# Patient Record
Sex: Male | Born: 1975 | Race: White | Hispanic: No | Marital: Married | Smoking: Never smoker
Health system: Southern US, Community
[De-identification: ages and names within clinical notes are randomized; demographics above are authoritative.]

## PROBLEM LIST (undated history)

## (undated) DIAGNOSIS — I2510 Atherosclerotic heart disease of native coronary artery without angina pectoris: Secondary | ICD-10-CM

## (undated) DIAGNOSIS — E079 Disorder of thyroid, unspecified: Secondary | ICD-10-CM

## (undated) DIAGNOSIS — E785 Hyperlipidemia, unspecified: Secondary | ICD-10-CM

## (undated) HISTORY — DX: Atherosclerotic heart disease of native coronary artery without angina pectoris: I25.10

## (undated) HISTORY — PX: EYE SURGERY: SHX253

---

## 2013-01-17 ENCOUNTER — Emergency Department (HOSPITAL_COMMUNITY)
Admission: EM | Admit: 2013-01-17 | Discharge: 2013-01-17 | Disposition: A | Discharge status: Home / Self Care | Payer: No Typology Code available for payment source | Attending: Emergency Medicine | Admitting: Emergency Medicine

## 2013-01-17 ENCOUNTER — Emergency Department (HOSPITAL_COMMUNITY): Payer: No Typology Code available for payment source

## 2013-01-17 ENCOUNTER — Encounter (HOSPITAL_COMMUNITY): Payer: Self-pay

## 2013-01-17 DIAGNOSIS — S59909A Unspecified injury of unspecified elbow, initial encounter: Secondary | ICD-10-CM | POA: Insufficient documentation

## 2013-01-17 DIAGNOSIS — R110 Nausea: Secondary | ICD-10-CM | POA: Insufficient documentation

## 2013-01-17 DIAGNOSIS — S6990XA Unspecified injury of unspecified wrist, hand and finger(s), initial encounter: Secondary | ICD-10-CM | POA: Insufficient documentation

## 2013-01-17 DIAGNOSIS — M25522 Pain in left elbow: Secondary | ICD-10-CM

## 2013-01-17 DIAGNOSIS — Y9389 Activity, other specified: Secondary | ICD-10-CM | POA: Insufficient documentation

## 2013-01-17 DIAGNOSIS — S0993XA Unspecified injury of face, initial encounter: Secondary | ICD-10-CM | POA: Insufficient documentation

## 2013-01-17 DIAGNOSIS — S0990XA Unspecified injury of head, initial encounter: Secondary | ICD-10-CM | POA: Insufficient documentation

## 2013-01-17 DIAGNOSIS — IMO0002 Reserved for concepts with insufficient information to code with codable children: Secondary | ICD-10-CM | POA: Insufficient documentation

## 2013-01-17 DIAGNOSIS — Z79899 Other long term (current) drug therapy: Secondary | ICD-10-CM | POA: Insufficient documentation

## 2013-01-17 DIAGNOSIS — T148XXA Other injury of unspecified body region, initial encounter: Secondary | ICD-10-CM

## 2013-01-17 DIAGNOSIS — E079 Disorder of thyroid, unspecified: Secondary | ICD-10-CM | POA: Insufficient documentation

## 2013-01-17 DIAGNOSIS — Y92410 Unspecified street and highway as the place of occurrence of the external cause: Secondary | ICD-10-CM | POA: Insufficient documentation

## 2013-01-17 HISTORY — DX: Disorder of thyroid, unspecified: E07.9
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
## 2013-01-17 NOTE — ED Notes (Signed)
Discharge instructions reviewed. Pt verbalized understanding.  
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
## 2013-01-18 NOTE — ED Provider Notes (Signed)
Medical screening examination/treatment/procedure(s) were performed by non-physician practitioner and as supervising physician I was immediately available for consultation/collaboration.   Janeth Terry M Kanisha Duba, MD 01/18/13 1235 

## 2015-02-27 IMAGING — CR DG ELBOW COMPLETE 3+V*L*
4 series · 4 of 4 positions shown · non-contrast
Comparison: None.

CLINICAL DATA: Left elbow injury.

EXAM:
LEFT ELBOW - COMPLETE 3+ VIEW

[x elbow joint ap left]
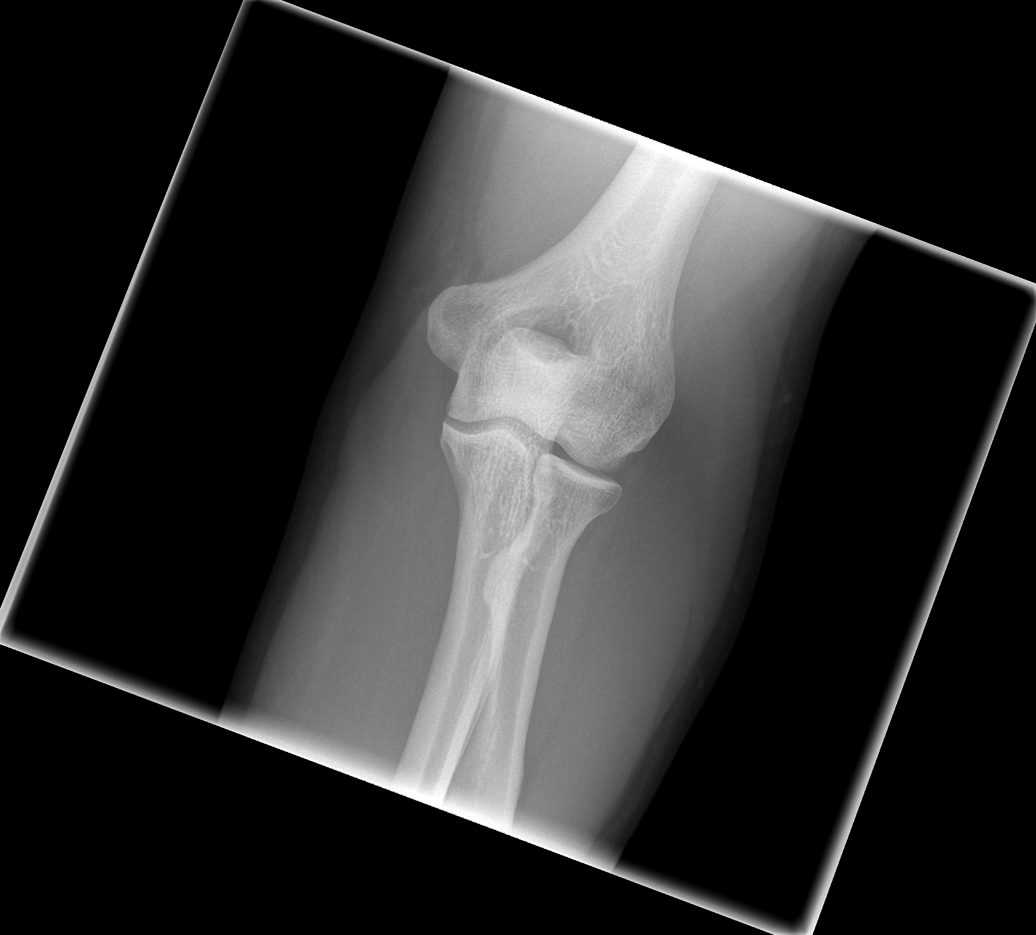

[x elbow joint obl. left (1 of 2)]
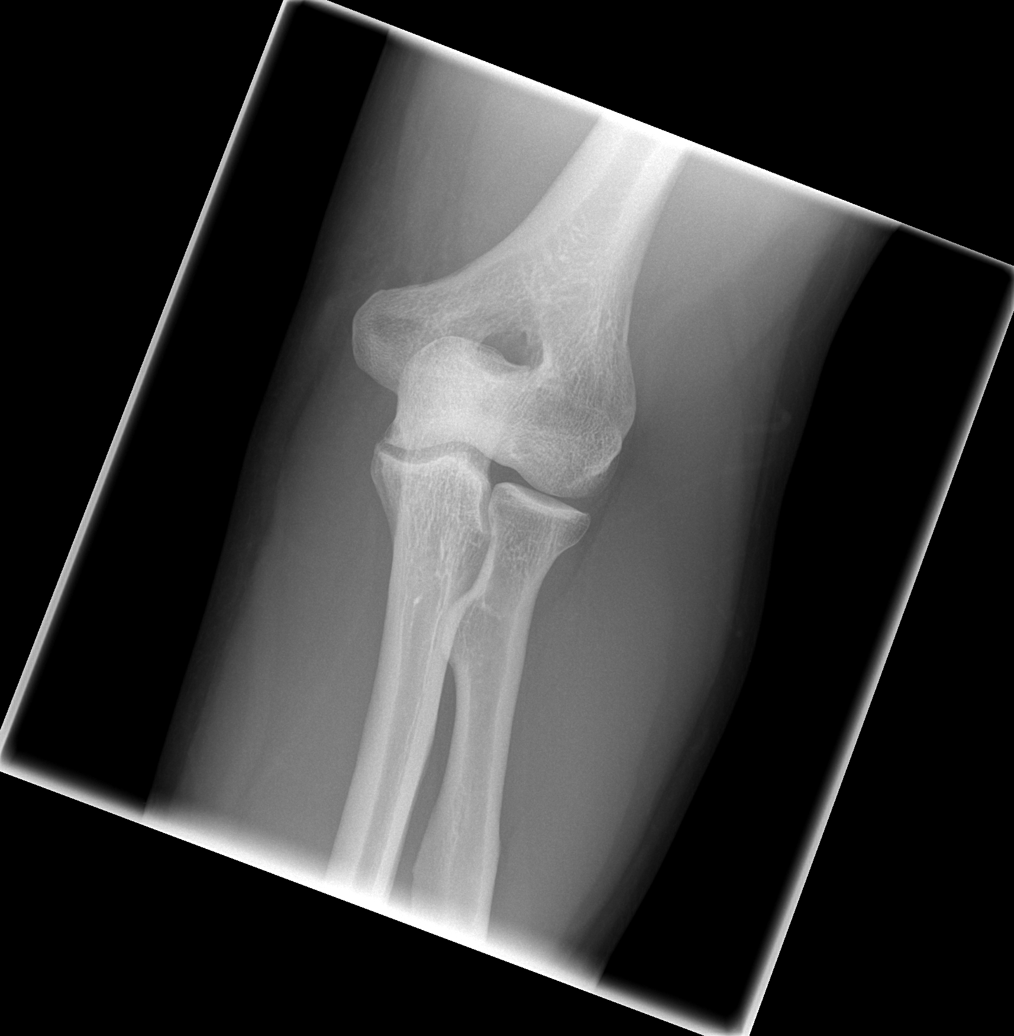

[x elbow joint obl. left (2 of 2)]
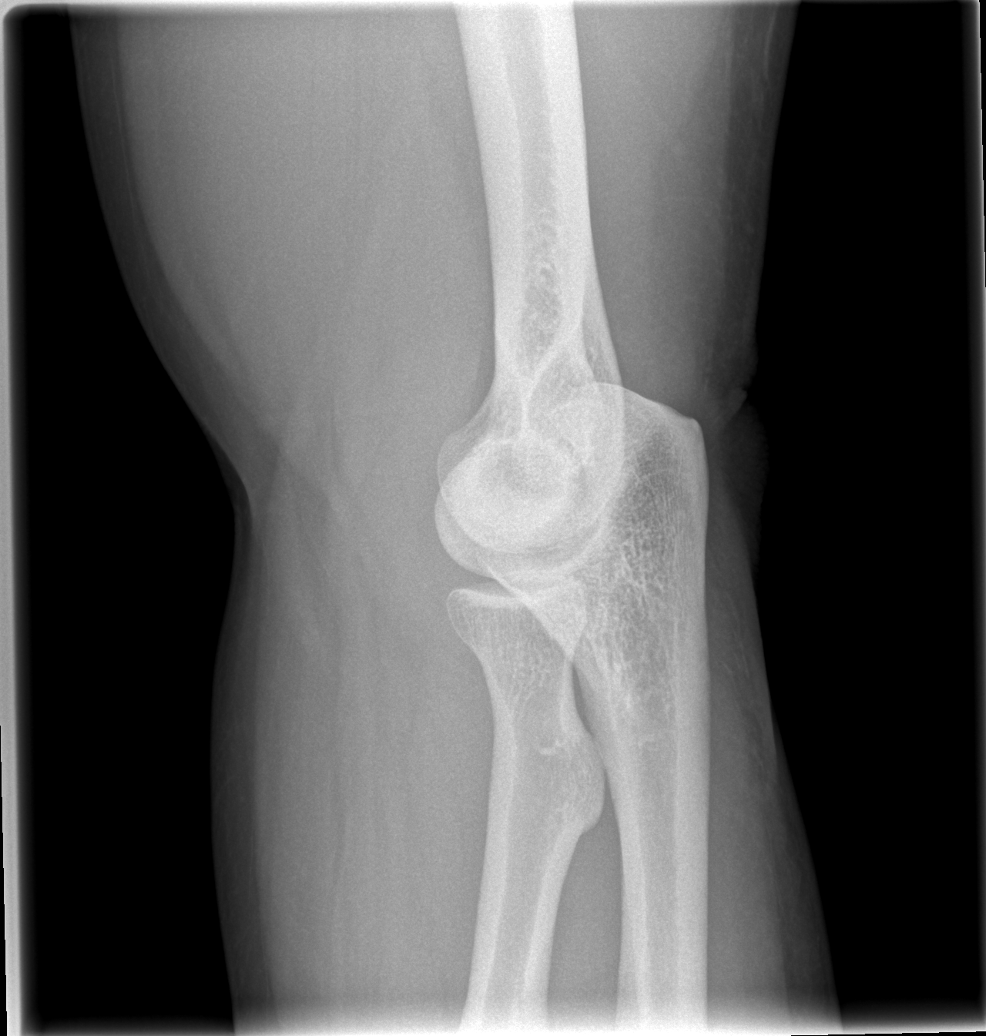

[x elbow joint lat left]
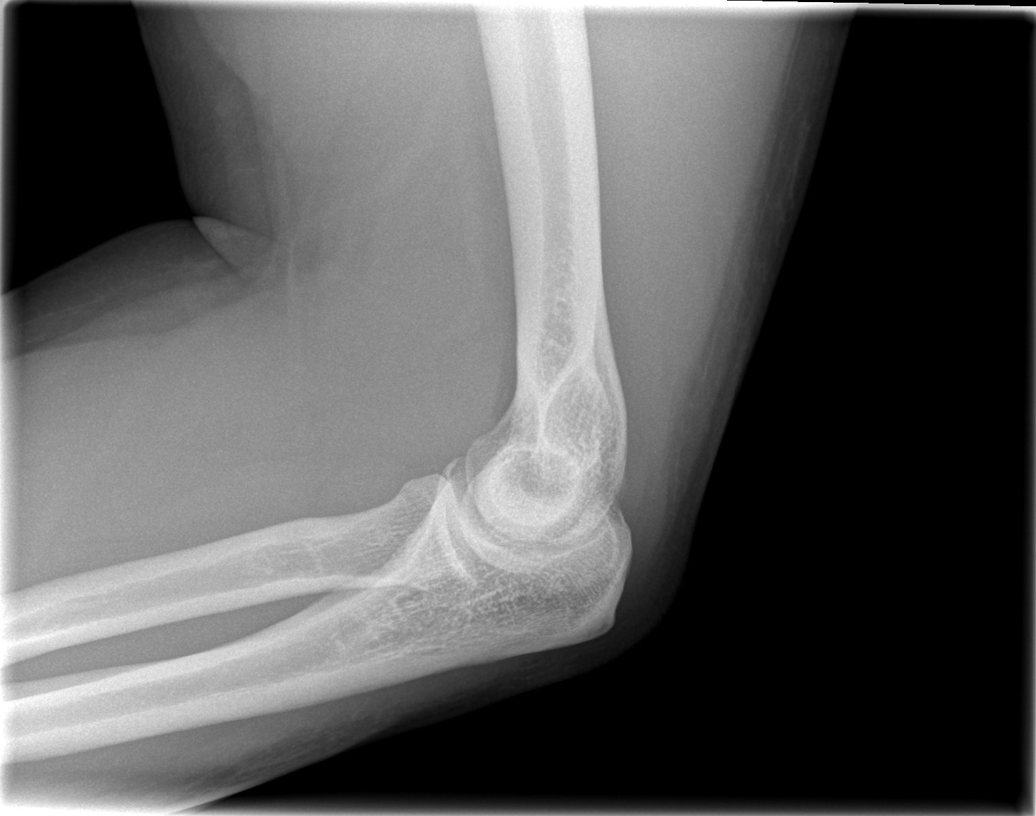

[4 of 4 positions shown; findings below may reference images not displayed]

FINDINGS: There is no evidence of fracture, dislocation, or joint effusion.
There is no evidence of arthropathy or other focal bone abnormality.
Soft tissues are unremarkable
IMPRESSION: Negative.

## 2018-08-15 ENCOUNTER — Emergency Department (HOSPITAL_COMMUNITY): Payer: 59

## 2018-08-15 ENCOUNTER — Encounter (HOSPITAL_COMMUNITY): Payer: Self-pay

## 2018-08-15 ENCOUNTER — Other Ambulatory Visit: Payer: Self-pay

## 2018-08-15 ENCOUNTER — Emergency Department (HOSPITAL_COMMUNITY)
Admission: EM | Admit: 2018-08-15 | Discharge: 2018-08-15 | Disposition: A | Discharge status: Home / Self Care | Payer: 59 | Attending: Emergency Medicine | Admitting: Emergency Medicine

## 2018-08-15 DIAGNOSIS — R05 Cough: Secondary | ICD-10-CM | POA: Diagnosis not present

## 2018-08-15 DIAGNOSIS — J069 Acute upper respiratory infection, unspecified: Secondary | ICD-10-CM | POA: Diagnosis not present

## 2018-08-15 DIAGNOSIS — R0602 Shortness of breath: Secondary | ICD-10-CM | POA: Insufficient documentation

## 2018-08-15 DIAGNOSIS — R0981 Nasal congestion: Secondary | ICD-10-CM | POA: Diagnosis not present

## 2018-08-15 DIAGNOSIS — E079 Disorder of thyroid, unspecified: Secondary | ICD-10-CM | POA: Insufficient documentation

## 2018-08-15 DIAGNOSIS — Z20828 Contact with and (suspected) exposure to other viral communicable diseases: Secondary | ICD-10-CM | POA: Insufficient documentation

## 2018-08-15 DIAGNOSIS — R059 Cough, unspecified: Secondary | ICD-10-CM

## 2018-08-15 DIAGNOSIS — R0789 Other chest pain: Secondary | ICD-10-CM | POA: Diagnosis not present

## 2018-08-15 LAB — BASIC METABOLIC PANEL WITH GFR
Anion gap: 14 (ref 5–15)
BUN: 15 mg/dL (ref 6–20)
CO2: 20 mmol/L — ABNORMAL LOW (ref 22–32)
Calcium: 8.9 mg/dL (ref 8.9–10.3)
Chloride: 104 mmol/L (ref 98–111)
Creatinine, Ser: 1 mg/dL (ref 0.61–1.24)
GFR calc Af Amer: >60 mL/min
GFR calc non Af Amer: >60 mL/min
Glucose, Bld: 86 mg/dL (ref 70–99)
Potassium: 3.8 mmol/L (ref 3.5–5.1)
Sodium: 138 mmol/L (ref 135–145)

## 2018-08-15 LAB — CBC
HCT: 44.5 % (ref 39.0–52.0)
Hemoglobin: 15 g/dL (ref 13.0–17.0)
MCH: 30.8 pg (ref 26.0–34.0)
MCHC: 33.7 g/dL (ref 30.0–36.0)
MCV: 91.4 fL (ref 80.0–100.0)
Platelets: 260 K/uL (ref 150–400)
RBC: 4.87 MIL/uL (ref 4.22–5.81)
RDW: 12.5 % (ref 11.5–15.5)
WBC: 8.4 K/uL (ref 4.0–10.5)
nRBC: 0 % (ref 0.0–0.2)

## 2018-08-15 LAB — TROPONIN I: Troponin I: <0.03 ng/mL (ref <0.03)

## 2018-08-15 MED ORDER — SODIUM CHLORIDE 0.9% FLUSH: 3.0000 mL | Freq: Once | INTRAVENOUS | Status: DC

## 2018-08-15 NOTE — Discharge Instructions (Addendum)
Person Under Monitoring Name: Todd [name]  Location: [address] [place] [address]   Infection Prevention Recommendations for Individuals Confirmed to have, or Being Evaluated for, 2019 Novel Coronavirus (COVID-19) Infection Who Receive Care at Home  Individuals who are confirmed to have, or are being evaluated for, COVID-19 should follow the prevention steps below until a healthcare provider or local or state health department says they can return to normal activities.  Stay home except to get medical care You should restrict activities outside your home, except for getting medical care. Do not go to work, school, or public areas, and do not use public transportation or taxis.  Call ahead before visiting your doctor Before your medical appointment, call the healthcare provider and tell them that you have, or are being evaluated for, COVID-19 infection. This will help the healthcare providers office take steps to keep other people from getting infected. Ask your healthcare provider to call the local or state health department.  Monitor your symptoms Seek prompt medical attention if your illness is worsening (e.g., difficulty breathing). Before going to your medical appointment, call the healthcare provider and tell them that you have, or are being evaluated for, COVID-19 infection. Ask your healthcare provider to call the local or state health department.  Wear a facemask You should wear a facemask that covers your nose and mouth when you are in the same room with other people and when you visit a healthcare provider. People who live with or visit you should also wear a facemask while they are in the same room with you.  Separate yourself from other people in your home As much as possible, you should stay in a different room from other people in your home. Also, you should use a separate bathroom, if available.  Avoid sharing household items You should not  share dishes, drinking glasses, cups, eating utensils, towels, bedding, or other items with other people in your home. After using these items, you should wash them thoroughly with soap and water.  Cover your coughs and sneezes Cover your mouth and nose with a tissue when you cough or sneeze, or you can cough or sneeze into your sleeve. Throw used tissues in a lined trash can, and immediately wash your hands with soap and water for at least 20 seconds or use an alcohol-based hand rub.  Wash your [employer] your hands often and thoroughly with soap and water for at least 20 seconds. You can use an alcohol-based hand sanitizer if soap and water are not available and if your hands are not visibly dirty. Avoid touching your eyes, nose, and mouth with unwashed hands.   Prevention Steps for Caregivers and Household Members of Individuals Confirmed to have, or Being Evaluated for, COVID-19 Infection Being Cared for in the Home  If you live with, or provide care at home for, a person confirmed to have, or being evaluated for, COVID-19 infection please follow these guidelines to prevent infection:  Follow healthcare providers instructions Make sure that you understand and can help the patient follow any healthcare provider instructions for all care.  Provide for the patients basic needs You should help the patient with basic needs in the home and provide support for getting groceries, prescriptions, and other personal needs.  Monitor the patients symptoms If they are getting sicker, call his or her medical provider and tell them that the patient has, or is being evaluated for, COVID-19 infection. This will help the healthcare providers office take  steps to keep other people from getting infected. Ask the healthcare provider to call the local or state health department.  Limit the number of people who have contact with the patient If possible, have only one caregiver for the  patient. Other household members should stay in another home or place of residence. If this is not possible, they should stay in another room, or be separated from the patient as much as possible. Use a separate bathroom, if available. Restrict visitors who do not have an essential need to be in the home.  Keep older adults, very young children, and other sick people away from the patient Keep older adults, very young children, and those who have compromised immune systems or chronic health conditions away from the patient. This includes people with chronic heart, lung, or kidney conditions, diabetes, and cancer.  Ensure good ventilation Make sure that shared spaces in the home have good air flow, such as from an air conditioner or an opened window, weather permitting.  Wash your hands often Wash your hands often and thoroughly with soap and water for at least 20 seconds. You can use an alcohol based hand sanitizer if soap and water are not available and if your hands are not visibly dirty. Avoid touching your eyes, nose, and mouth with unwashed hands. Use disposable paper towels to dry your hands. If not available, use dedicated cloth towels and replace them when they become wet.  Wear a facemask and gloves Wear a disposable facemask at all times in the room and gloves when you touch or have contact with the patients blood, body fluids, and/or secretions or excretions, such as sweat, saliva, sputum, nasal mucus, vomit, urine, or feces.  Ensure the mask fits over your nose and mouth tightly, and do not touch it during use. Throw out disposable facemasks and gloves after using them. Do not reuse. Wash your hands immediately after removing your facemask and gloves. If your personal clothing becomes contaminated, carefully remove clothing and launder. Wash your hands after handling contaminated clothing. Place all used disposable facemasks, gloves, and other waste in a lined container before  disposing them with other household waste. Remove gloves and wash your hands immediately after handling these items.  Do not share dishes, glasses, or other household items with the patient Avoid sharing household items. You should not share dishes, drinking glasses, cups, eating utensils, towels, bedding, or other items with a patient who is confirmed to have, or being evaluated for, COVID-19 infection. After the person uses these items, you should wash them thoroughly with soap and water.  Wash laundry thoroughly Immediately remove and wash clothes or bedding that have blood, body fluids, and/or secretions or excretions, such as sweat, saliva, sputum, nasal mucus, vomit, urine, or feces, on them. Wear gloves when handling laundry from the patient. Read and follow directions on labels of laundry or clothing items and detergent. In general, wash and dry with the warmest temperatures recommended on the label.  Clean all areas the individual has used often Clean all touchable surfaces, such as counters, tabletops, doorknobs, bathroom fixtures, toilets, phones, keyboards, tablets, and bedside tables, every day. Also, clean any surfaces that may have blood, body fluids, and/or secretions or excretions on them. Wear gloves when cleaning surfaces the patient has come in contact with. Use a diluted bleach solution (e.g., dilute bleach with 1 part bleach and 10 parts water) or a household disinfectant with a label that says EPA-registered for coronaviruses. To make a bleach solution  at home, add 1 tablespoon of bleach to 1 quart (4 cups) of water. For a larger supply, add  cup of bleach to 1 gallon (16 cups) of water. Read labels of cleaning products and follow recommendations provided on product labels. Labels contain instructions for safe and effective use of the cleaning product including precautions you should take when applying the product, such as wearing gloves or eye protection and making sure you  have good ventilation during use of the product. Remove gloves and wash hands immediately after cleaning.  Monitor yourself for signs and symptoms of illness Caregivers and household members are considered close contacts, should monitor their health, and will be asked to limit movement outside of the home to the extent possible. Follow the monitoring steps for close contacts listed on the symptom monitoring form.   ? If you have additional questions, contact your local health department or call the epidemiologist on call at [phone] (available 24/7). ? This guidance is subject to change. For the most up-to-date guidance from [facility], please refer to their website: [email or web address]

## 2018-08-15 NOTE — ED Provider Notes (Signed)
[facility] EMERGENCY DEPARTMENT Provider Note   CSN: [id number] Arrival date & time: 08/15/18  1737    History   Chief Complaint Chief Complaint  Patient presents with  . Chest Pain  . Shortness of Breath  . Cough    HPI Todd [name] is a 43 y.o. male.     HPI 43 year old man with no significant past medical history presents to the emergency department for evaluation of URI symptoms including cough, nasal congestion, chest tightness over the last several days.  No fevers at home that he is aware of.  Has had a decreased appetite but otherwise feeling well.  Went for a jog 2 days ago and felt somewhat more fatigued than usual.  Denies any chest pain at this time.  No family history of cardiac disease that he is aware of.  Does not smoke, drink alcohol or use illicit substances.  No history of hypertension or diabetes that he is aware of. Past Medical History:  Diagnosis Date  . Thyroid disease     There are no active problems to display for this patient.   History reviewed. No pertinent surgical history.      Home Medications    Prior to Admission medications   Medication Sig Start Date End Date Taking? Authorizing Provider  levothyroxine (SYNTHROID, LEVOTHROID) 88 MCG tablet Take 88 mcg by mouth daily before breakfast.    [provider]    Family History No family history on file.  Social History Social History   Tobacco Use  . Smoking status: Never Smoker  Substance Use Topics  . Alcohol use: No  . Drug use: No     Allergies   Tretinoin   Review of Systems Review of Systems  Constitutional: Negative for chills, fatigue and fever.  HENT: Negative for ear pain and sore throat.   Eyes: Negative for pain and visual disturbance.  Respiratory: Positive for cough, chest tightness and shortness of breath.   Cardiovascular: Negative for chest pain and palpitations.  Gastrointestinal: Negative for abdominal pain and vomiting.   Endocrine: Negative for polyphagia and polyuria.  Genitourinary: Negative for dysuria and hematuria.  Musculoskeletal: Negative for arthralgias and back pain.  Skin: Negative for color change and rash.  Allergic/Immunologic: Negative for immunocompromised state.  Neurological: Negative for seizures and syncope.  All other systems reviewed and are negative.    Physical Exam Updated Vital Signs BP (!) 135/91 (BP Location: Right Arm)   Pulse 80   Temp 98.2 F (36.8 C) (Oral)   Resp 18   Ht 6' (1.829 m)   Wt 122.5 kg   SpO2 95%   BMI 36.62 kg/m   Physical Exam Vitals signs and nursing note reviewed.  Constitutional:      Appearance: He is well-developed. He is not ill-appearing.  HENT:     Head: Normocephalic and atraumatic.  Eyes:     Extraocular Movements: Extraocular movements intact.     Conjunctiva/sclera: Conjunctivae normal.     Pupils: Pupils are equal, round, and reactive to light.  Neck:     Musculoskeletal: Neck supple.  Cardiovascular:     Rate and Rhythm: Normal rate and regular rhythm.     Heart sounds: No murmur.  Pulmonary:     Effort: Pulmonary effort is normal. No respiratory distress.     Breath sounds: Normal breath sounds.  Chest:     Chest wall: No tenderness or edema.  Abdominal:     Palpations: Abdomen is soft.  Tenderness: There is no abdominal tenderness.  Musculoskeletal:     Right lower leg: He exhibits no tenderness. No edema.     Left lower leg: He exhibits no tenderness. No edema.  Skin:    General: Skin is warm and dry.     Capillary Refill: Capillary refill takes less than 2 seconds.  Neurological:     General: No focal deficit present.     Mental Status: He is alert.     Cranial Nerves: No cranial nerve deficit.     Motor: No weakness.      ED Treatments / Results  Labs (all labs ordered are listed, but only abnormal results are displayed) Labs Reviewed  CBC  BASIC METABOLIC PANEL  TROPONIN I    EKG None   Radiology Dg Chest Portable 1 View  Result Date: 08/15/2018 CLINICAL DATA:  Shortness of breath, chest pain, cough, chills and congestion worsening over past week, has been exposed to COVID-19 positive patients at work EXAM: PORTABLE CHEST 1 VIEW COMPARISON:  Portable exam 1758 hours without priors for comparison FINDINGS: Normal heart size, mediastinal contours, and pulmonary vascularity. Lungs clear. No infiltrate, pleural effusion or pneumothorax. Bones unremarkable. IMPRESSION: No acute abnormalities. Electronically Signed   By: Ulyses Southward M.D.   On: 08/15/2018 18:28    Procedures Procedures (including critical care time)  Medications Ordered in ED Medications  sodium chloride flush (NS) 0.9 % injection 3 mL (has no administration in time range)     Initial Impression / Assessment and Plan / ED Course  I have reviewed the triage vital signs and the nursing notes.  Pertinent labs & imaging results that were available during my care of the patient were reviewed by me and considered in my medical decision making (see chart for details).        Well-appearing hemodynamically stable 43 year old man presents to the emergency department for evaluation of URI symptoms.  Has had multiple positive COVID contacts at work.  Would like to be tested for the coronavirus.  We have ordered the send out test for this patient as I believe he does not need to come into the hospital.  EKG shows sinus rhythm without ischemic changes such as ST depression or elevation or T wave inversions.  He does not have a history of cardiac disease nor do I believe that this is cardiac given his other symptoms of URI infection.  We will not test with an RVP at this time as it will not change management.  His daughter also has similar symptoms.  He would like to go back to work so we are performing the coronavirus test for him.  Patient told to self isolate until this test returned.  Told that if it returned positive he  would likely need to wait another 7 days or 3 days until after symptoms have gone away.  Low suspicion for ACS in this patient is his first troponin was negative and he has had ongoing symptoms for several days.  I believe he is stable for discharge and the patient is in agreement with this plan.  Final Clinical Impressions(s) / ED Diagnoses   Final diagnoses:  Cough  Viral upper respiratory tract infection    ED Discharge Orders    None       Ina Kick, MD 08/15/18 2208    Tilden Fossa, MD 08/16/18 [phone]

## 2018-08-15 NOTE — ED Triage Notes (Signed)
Pt repots cp, sob, cough, chills and congestion that has gotten worse over the past week. Pt has been exposed to positive COVID pts at work. Nad noted at this time

## 2018-08-15 NOTE — ED Notes (Signed)
Provider at bedside

## 2018-08-15 NOTE — ED Notes (Signed)
Discharge instructions discussed with pt. Pt has no questions at this time.   Unable to obtain signature pad not working.

## 2018-08-17 LAB — NOVEL CORONAVIRUS, NAA (HOSP ORDER, SEND-OUT TO REF LAB; TAT 18-24 HRS): SARS-CoV-2, NAA: NOT DETECTED

## 2020-02-19 DIAGNOSIS — E782 Mixed hyperlipidemia: Secondary | ICD-10-CM | POA: Diagnosis not present

## 2020-02-19 DIAGNOSIS — E8881 Metabolic syndrome: Secondary | ICD-10-CM | POA: Diagnosis not present

## 2020-02-19 DIAGNOSIS — Z6839 Body mass index (BMI) 39.0-39.9, adult: Secondary | ICD-10-CM | POA: Diagnosis not present

## 2020-04-06 DIAGNOSIS — U071 COVID-19: Secondary | ICD-10-CM | POA: Diagnosis not present

## 2020-04-20 DIAGNOSIS — Z79899 Other long term (current) drug therapy: Secondary | ICD-10-CM | POA: Diagnosis not present

## 2020-04-20 DIAGNOSIS — Z125 Encounter for screening for malignant neoplasm of prostate: Secondary | ICD-10-CM | POA: Diagnosis not present

## 2020-04-20 DIAGNOSIS — E039 Hypothyroidism, unspecified: Secondary | ICD-10-CM | POA: Diagnosis not present

## 2020-04-20 DIAGNOSIS — Z0000 Encounter for general adult medical examination without abnormal findings: Secondary | ICD-10-CM | POA: Diagnosis not present

## 2020-04-20 DIAGNOSIS — Z6839 Body mass index (BMI) 39.0-39.9, adult: Secondary | ICD-10-CM | POA: Diagnosis not present

## 2020-04-20 DIAGNOSIS — E782 Mixed hyperlipidemia: Secondary | ICD-10-CM | POA: Diagnosis not present

## 2020-04-21 DIAGNOSIS — Z125 Encounter for screening for malignant neoplasm of prostate: Secondary | ICD-10-CM | POA: Diagnosis not present

## 2020-04-21 DIAGNOSIS — Z0000 Encounter for general adult medical examination without abnormal findings: Secondary | ICD-10-CM | POA: Diagnosis not present

## 2020-04-21 DIAGNOSIS — Z79899 Other long term (current) drug therapy: Secondary | ICD-10-CM | POA: Diagnosis not present

## 2020-04-21 DIAGNOSIS — E782 Mixed hyperlipidemia: Secondary | ICD-10-CM | POA: Diagnosis not present

## 2020-04-21 DIAGNOSIS — E291 Testicular hypofunction: Secondary | ICD-10-CM | POA: Diagnosis not present

## 2020-04-21 DIAGNOSIS — E039 Hypothyroidism, unspecified: Secondary | ICD-10-CM | POA: Diagnosis not present

## 2020-09-24 IMAGING — DX PORTABLE CHEST - 1 VIEW
1 series · 1 of 1 positions shown · non-contrast
Comparison: Portable exam 7981 hours without priors for comparison

CLINICAL DATA: Shortness of breath, chest pain, cough, chills and
congestion worsening over past week, has been exposed to [id number]
positive patients at work

EXAM:
PORTABLE CHEST 1 VIEW

[chest]
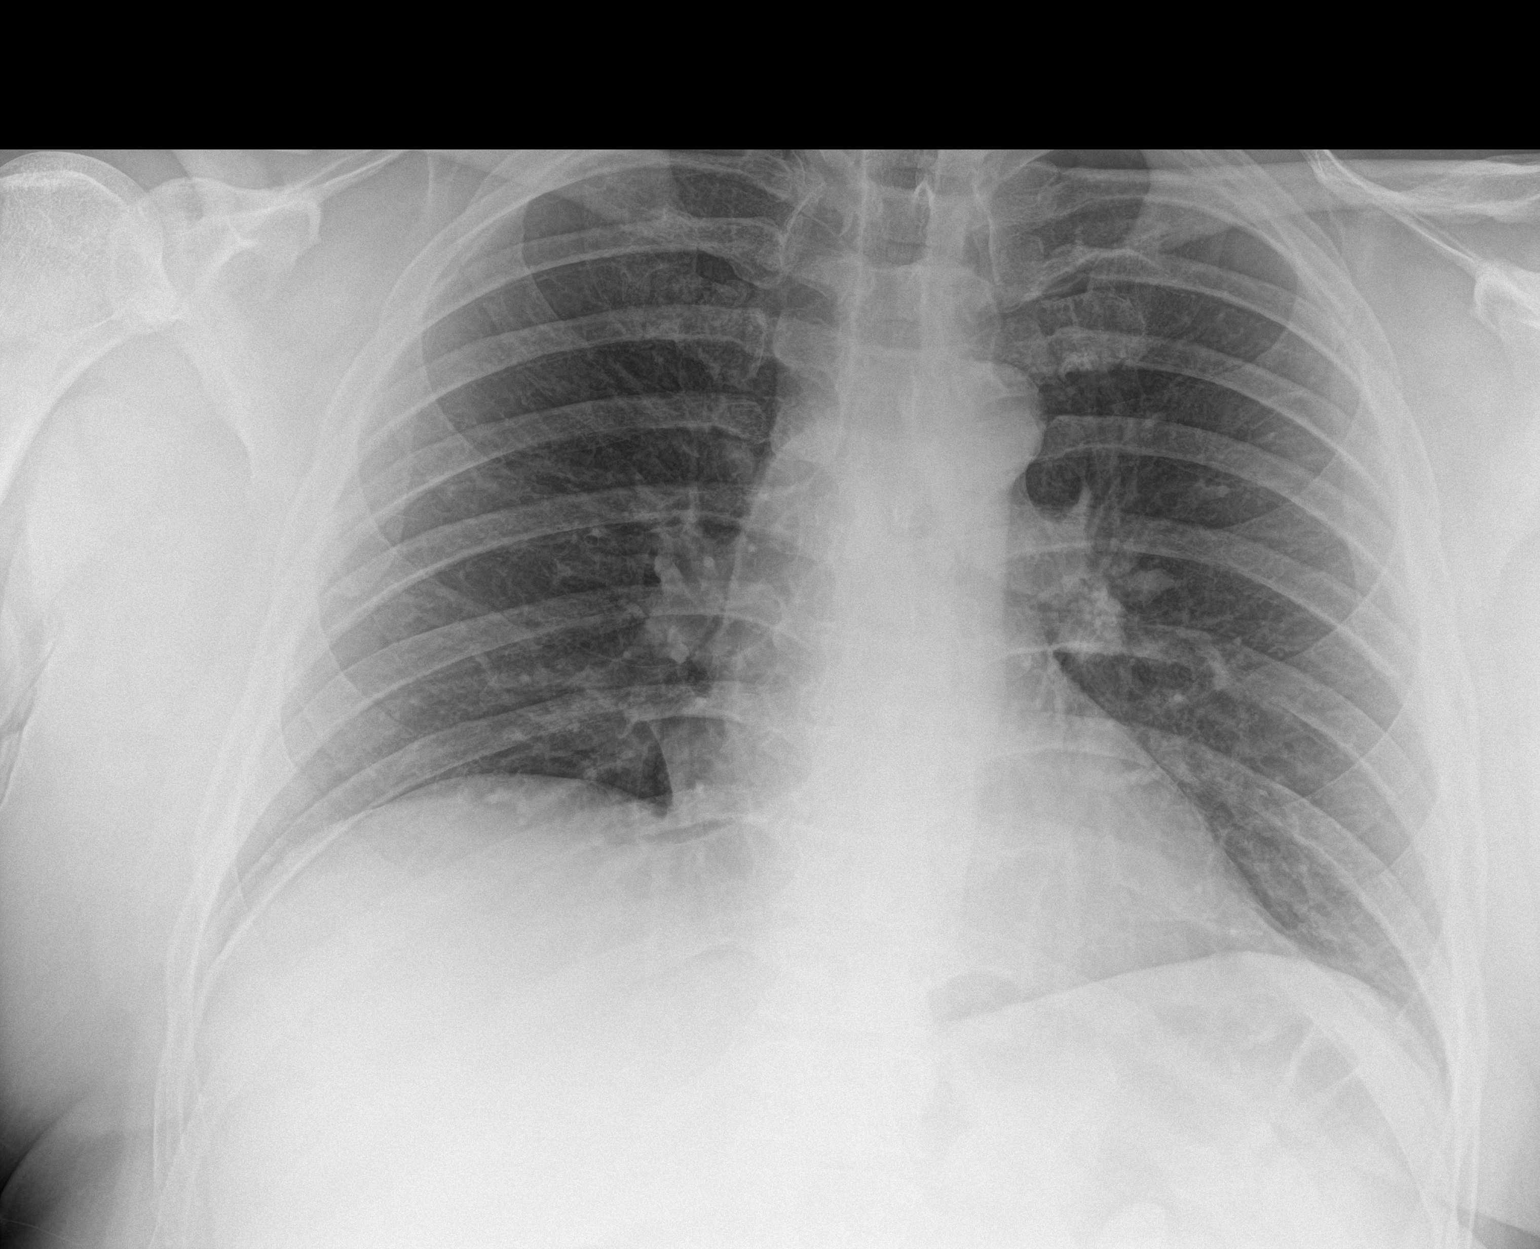

[1 of 1 positions shown; findings below may reference images not displayed]

FINDINGS: Normal heart size, mediastinal contours, and pulmonary vascularity.

Lungs clear.

No infiltrate, pleural effusion or pneumothorax.

Bones unremarkable.
IMPRESSION: No acute abnormalities.

## 2020-11-08 ENCOUNTER — Encounter (HOSPITAL_BASED_OUTPATIENT_CLINIC_OR_DEPARTMENT_OTHER): Payer: Self-pay | Admitting: Radiology

## 2020-11-08 ENCOUNTER — Other Ambulatory Visit (HOSPITAL_BASED_OUTPATIENT_CLINIC_OR_DEPARTMENT_OTHER): Payer: Self-pay | Admitting: Adult Health Nurse Practitioner

## 2020-11-08 ENCOUNTER — Ambulatory Visit (HOSPITAL_BASED_OUTPATIENT_CLINIC_OR_DEPARTMENT_OTHER)
Admission: RE | Admit: 2020-11-08 | Discharge: 2020-11-08 | Disposition: A | Discharge status: Home / Self Care | Payer: No Typology Code available for payment source | Source: Ambulatory Visit | Attending: Adult Health Nurse Practitioner | Admitting: Adult Health Nurse Practitioner

## 2020-11-08 ENCOUNTER — Other Ambulatory Visit: Payer: Self-pay | Admitting: Adult Health Nurse Practitioner

## 2020-11-08 ENCOUNTER — Other Ambulatory Visit: Payer: Self-pay

## 2020-11-08 DIAGNOSIS — R1032 Left lower quadrant pain: Secondary | ICD-10-CM | POA: Diagnosis present

## 2020-11-08 MED ORDER — IOHEXOL 350 MG/ML SOLN
80.0000 mL | Freq: Once | INTRAVENOUS | Status: AC | PRN
  Administered 2020-11-08: 80 mL via INTRAVENOUS

## 2021-06-13 DIAGNOSIS — S62655A Nondisplaced fracture of medial phalanx of left ring finger, initial encounter for closed fracture: Secondary | ICD-10-CM | POA: Diagnosis not present

## 2021-07-12 DIAGNOSIS — E782 Mixed hyperlipidemia: Secondary | ICD-10-CM | POA: Diagnosis not present

## 2021-07-12 DIAGNOSIS — Z79899 Other long term (current) drug therapy: Secondary | ICD-10-CM | POA: Diagnosis not present

## 2021-07-12 DIAGNOSIS — B001 Herpesviral vesicular dermatitis: Secondary | ICD-10-CM | POA: Diagnosis not present

## 2021-07-12 DIAGNOSIS — Z125 Encounter for screening for malignant neoplasm of prostate: Secondary | ICD-10-CM | POA: Diagnosis not present

## 2021-07-12 DIAGNOSIS — E291 Testicular hypofunction: Secondary | ICD-10-CM | POA: Diagnosis not present

## 2021-07-12 DIAGNOSIS — E039 Hypothyroidism, unspecified: Secondary | ICD-10-CM | POA: Diagnosis not present

## 2021-07-12 DIAGNOSIS — Z0000 Encounter for general adult medical examination without abnormal findings: Secondary | ICD-10-CM | POA: Diagnosis not present

## 2021-12-21 ENCOUNTER — Other Ambulatory Visit: Payer: Self-pay

## 2021-12-21 ENCOUNTER — Emergency Department (HOSPITAL_BASED_OUTPATIENT_CLINIC_OR_DEPARTMENT_OTHER): Payer: BC Managed Care – PPO | Admitting: Radiology

## 2021-12-21 ENCOUNTER — Encounter (HOSPITAL_BASED_OUTPATIENT_CLINIC_OR_DEPARTMENT_OTHER): Payer: Self-pay | Admitting: Emergency Medicine

## 2021-12-21 ENCOUNTER — Inpatient Hospital Stay (HOSPITAL_BASED_OUTPATIENT_CLINIC_OR_DEPARTMENT_OTHER)
Admission: EM | Admit: 2021-12-21 | Discharge: 2021-12-23 | DRG: 246 | Disposition: A | Discharge status: Home / Self Care | Payer: BC Managed Care – PPO | Attending: Cardiovascular Disease | Admitting: Cardiovascular Disease

## 2021-12-21 ENCOUNTER — Inpatient Hospital Stay (HOSPITAL_COMMUNITY)
Admission: EM | Disposition: A | Discharge status: Home / Self Care | Payer: Self-pay | Source: Home / Self Care | Attending: Cardiovascular Disease

## 2021-12-21 ENCOUNTER — Other Ambulatory Visit (HOSPITAL_COMMUNITY): Payer: BC Managed Care – PPO

## 2021-12-21 ENCOUNTER — Emergency Department (HOSPITAL_BASED_OUTPATIENT_CLINIC_OR_DEPARTMENT_OTHER): Payer: BC Managed Care – PPO

## 2021-12-21 DIAGNOSIS — Z888 Allergy status to other drugs, medicaments and biological substances status: Secondary | ICD-10-CM | POA: Diagnosis not present

## 2021-12-21 DIAGNOSIS — E785 Hyperlipidemia, unspecified: Secondary | ICD-10-CM | POA: Diagnosis present

## 2021-12-21 DIAGNOSIS — E782 Mixed hyperlipidemia: Secondary | ICD-10-CM | POA: Diagnosis present

## 2021-12-21 DIAGNOSIS — I472 Ventricular tachycardia, unspecified: Secondary | ICD-10-CM | POA: Diagnosis not present

## 2021-12-21 DIAGNOSIS — F419 Anxiety disorder, unspecified: Secondary | ICD-10-CM | POA: Diagnosis present

## 2021-12-21 DIAGNOSIS — Z79890 Hormone replacement therapy (postmenopausal): Secondary | ICD-10-CM

## 2021-12-21 DIAGNOSIS — E039 Hypothyroidism, unspecified: Secondary | ICD-10-CM | POA: Diagnosis present

## 2021-12-21 DIAGNOSIS — E669 Obesity, unspecified: Secondary | ICD-10-CM | POA: Diagnosis not present

## 2021-12-21 DIAGNOSIS — Z8249 Family history of ischemic heart disease and other diseases of the circulatory system: Secondary | ICD-10-CM

## 2021-12-21 DIAGNOSIS — Z8674 Personal history of sudden cardiac arrest: Secondary | ICD-10-CM | POA: Diagnosis present

## 2021-12-21 DIAGNOSIS — Z79899 Other long term (current) drug therapy: Secondary | ICD-10-CM

## 2021-12-21 DIAGNOSIS — Z20822 Contact with and (suspected) exposure to covid-19: Secondary | ICD-10-CM | POA: Diagnosis present

## 2021-12-21 DIAGNOSIS — I2510 Atherosclerotic heart disease of native coronary artery without angina pectoris: Secondary | ICD-10-CM | POA: Diagnosis not present

## 2021-12-21 DIAGNOSIS — I4729 Other ventricular tachycardia: Secondary | ICD-10-CM | POA: Diagnosis present

## 2021-12-21 DIAGNOSIS — I213 ST elevation (STEMI) myocardial infarction of unspecified site: Principal | ICD-10-CM

## 2021-12-21 DIAGNOSIS — Z136 Encounter for screening for cardiovascular disorders: Secondary | ICD-10-CM | POA: Diagnosis not present

## 2021-12-21 DIAGNOSIS — I25110 Atherosclerotic heart disease of native coronary artery with unstable angina pectoris: Secondary | ICD-10-CM | POA: Diagnosis not present

## 2021-12-21 DIAGNOSIS — Z95820 Peripheral vascular angioplasty status with implants and grafts: Secondary | ICD-10-CM

## 2021-12-21 DIAGNOSIS — I2121 ST elevation (STEMI) myocardial infarction involving left circumflex coronary artery: Secondary | ICD-10-CM | POA: Diagnosis not present

## 2021-12-21 DIAGNOSIS — I2129 ST elevation (STEMI) myocardial infarction involving other sites: Secondary | ICD-10-CM | POA: Diagnosis not present

## 2021-12-21 DIAGNOSIS — I4721 Torsades de pointes: Secondary | ICD-10-CM | POA: Diagnosis present

## 2021-12-21 DIAGNOSIS — I469 Cardiac arrest, cause unspecified: Secondary | ICD-10-CM

## 2021-12-21 DIAGNOSIS — I4901 Ventricular fibrillation: Secondary | ICD-10-CM | POA: Diagnosis present

## 2021-12-21 DIAGNOSIS — I462 Cardiac arrest due to underlying cardiac condition: Secondary | ICD-10-CM | POA: Diagnosis present

## 2021-12-21 DIAGNOSIS — R109 Unspecified abdominal pain: Secondary | ICD-10-CM | POA: Diagnosis not present

## 2021-12-21 DIAGNOSIS — Z7902 Long term (current) use of antithrombotics/antiplatelets: Secondary | ICD-10-CM

## 2021-12-21 DIAGNOSIS — Z6833 Body mass index (BMI) 33.0-33.9, adult: Secondary | ICD-10-CM

## 2021-12-21 DIAGNOSIS — R079 Chest pain, unspecified: Secondary | ICD-10-CM | POA: Diagnosis not present

## 2021-12-21 HISTORY — PX: CORONARY/GRAFT ACUTE MI REVASCULARIZATION: CATH118305

## 2021-12-21 HISTORY — DX: Hyperlipidemia, unspecified: E78.5

## 2021-12-21 HISTORY — PX: LEFT HEART CATH AND CORONARY ANGIOGRAPHY: CATH118249

## 2021-12-21 LAB — CBC
HCT: 50.8 % (ref 39.0–52.0)
Hemoglobin: 17.5 g/dL — ABNORMAL HIGH (ref 13.0–17.0)
MCH: 32.1 pg (ref 26.0–34.0)
MCHC: 34.4 g/dL (ref 30.0–36.0)
MCV: 93 fL (ref 80.0–100.0)
Platelets: 241 10*3/uL (ref 150–400)
RBC: 5.46 MIL/uL (ref 4.22–5.81)
RDW: 12.7 % (ref 11.5–15.5)
WBC: 5.6 10*3/uL (ref 4.0–10.5)
nRBC: 0 % (ref 0.0–0.2)

## 2021-12-21 LAB — BASIC METABOLIC PANEL
Anion gap: 9 (ref 5–15)
BUN: 20 mg/dL (ref 6–20)
CO2: 23 mmol/L (ref 22–32)
Calcium: 8.7 mg/dL — ABNORMAL LOW (ref 8.9–10.3)
Chloride: 106 mmol/L (ref 98–111)
Creatinine, Ser: 1.03 mg/dL (ref 0.61–1.24)
GFR, Estimated: >60 mL/min (ref >60)
Glucose, Bld: 106 mg/dL — ABNORMAL HIGH (ref 70–99)
Potassium: 4.1 mmol/L (ref 3.5–5.1)
Sodium: 138 mmol/L (ref 135–145)

## 2021-12-21 LAB — LIPID PANEL
Cholesterol: 159 mg/dL (ref 0–200)
HDL: 30 mg/dL — ABNORMAL LOW (ref >40)
LDL Cholesterol: 104 mg/dL — ABNORMAL HIGH (ref 0–99)
Total CHOL/HDL Ratio: 5.3 RATIO
Triglycerides: 126 mg/dL (ref <150)
VLDL: 25 mg/dL (ref 0–40)

## 2021-12-21 LAB — MRSA NEXT GEN BY PCR, NASAL: MRSA by PCR Next Gen: NOT DETECTED

## 2021-12-21 LAB — TROPONIN I (HIGH SENSITIVITY)
Troponin I (High Sensitivity): 9 ng/L (ref <18)
Troponin I (High Sensitivity): 27 ng/L — ABNORMAL HIGH (ref <18)

## 2021-12-21 LAB — MAGNESIUM: Magnesium: 2.1 mg/dL (ref 1.7–2.4)

## 2021-12-21 LAB — POCT ACTIVATED CLOTTING TIME
Activated Clotting Time: 263 seconds
Activated Clotting Time: 366 seconds

## 2021-12-21 LAB — SARS CORONAVIRUS 2 BY RT PCR: SARS Coronavirus 2 by RT PCR: NEGATIVE

## 2021-12-21 SURGERY — LEFT HEART CATH AND CORONARY ANGIOGRAPHY
Anesthesia: LOCAL

## 2021-12-21 MED ORDER — NOREPINEPHRINE 4 MG/250ML-% IV SOLN
INTRAVENOUS | Status: AC
  Filled 2021-12-21: qty 250

## 2021-12-21 MED ORDER — MIDAZOLAM HCL 2 MG/2ML IJ SOLN
INTRAMUSCULAR | Status: AC
  Filled 2021-12-21: qty 2

## 2021-12-21 MED ORDER — TIROFIBAN HCL IN NACL 5-0.9 MG/100ML-% IV SOLN
INTRAVENOUS | Status: AC | PRN
  Administered 2021-12-21: .075 ug/kg/min via INTRAVENOUS

## 2021-12-21 MED ORDER — AMIODARONE LOAD VIA INFUSION
150.0000 mg | Freq: Once | INTRAVENOUS | Status: AC
  Administered 2021-12-21: 150 mg via INTRAVENOUS
  Filled 2021-12-21: qty 83.34

## 2021-12-21 MED ORDER — LIDOCAINE HCL (PF) 1 % IJ SOLN
INTRAMUSCULAR | Status: DC | PRN
  Administered 2021-12-21: 2 mL via INTRADERMAL

## 2021-12-21 MED ORDER — HEPARIN SODIUM (PORCINE) 1000 UNIT/ML IJ SOLN
INTRAMUSCULAR | Status: DC | PRN
  Administered 2021-12-21: 10000 [IU] via INTRAVENOUS
  Administered 2021-12-21: 2000 [IU] via INTRAVENOUS

## 2021-12-21 MED ORDER — EPINEPHRINE 1 MG/10ML IJ SOSY
PREFILLED_SYRINGE | INTRAMUSCULAR | Status: AC | PRN
  Administered 2021-12-21: .1 mg via INTRAVENOUS

## 2021-12-21 MED ORDER — SODIUM CHLORIDE 0.9% FLUSH
3.0000 mL | INTRAVENOUS | Status: DC | PRN
Start: 2021-12-21 — End: 2021-12-23

## 2021-12-21 MED ORDER — KETOROLAC TROMETHAMINE 30 MG/ML IJ SOLN
30.0000 mg | Freq: Once | INTRAMUSCULAR | Status: AC
  Administered 2021-12-21: 30 mg via INTRAVENOUS
  Filled 2021-12-21: qty 1

## 2021-12-21 MED ORDER — HEPARIN SODIUM (PORCINE) 1000 UNIT/ML IJ SOLN
INTRAMUSCULAR | Status: AC
  Filled 2021-12-21: qty 10

## 2021-12-21 MED ORDER — HEPARIN BOLUS VIA INFUSION
4000.0000 [IU] | Freq: Once | INTRAVENOUS | Status: AC
  Administered 2021-12-21: 4000 [IU] via INTRAVENOUS

## 2021-12-21 MED ORDER — SODIUM CHLORIDE 0.9 % IV SOLN
INTRAVENOUS | Status: AC | PRN
  Administered 2021-12-21: 100 mL/h via INTRAVENOUS

## 2021-12-21 MED ORDER — NOREPINEPHRINE BITARTRATE 1 MG/ML IV SOLN
INTRAVENOUS | Status: AC | PRN
  Administered 2021-12-21: 5 ug/min via INTRAVENOUS

## 2021-12-21 MED ORDER — FENTANYL CITRATE (PF) 100 MCG/2ML IJ SOLN: INTRAMUSCULAR | Status: DC | PRN

## 2021-12-21 MED ORDER — MIDAZOLAM HCL 2 MG/2ML IJ SOLN: INTRAMUSCULAR | Status: DC | PRN

## 2021-12-21 MED ORDER — AMIODARONE HCL IN DEXTROSE 360-4.14 MG/200ML-% IV SOLN
60.0000 mg/h | INTRAVENOUS | Status: DC
  Administered 2021-12-21 (×2): 60 mg/h via INTRAVENOUS
  Filled 2021-12-21: qty 200

## 2021-12-21 MED ORDER — ASPIRIN 81 MG PO CHEW
81.0000 mg | CHEWABLE_TABLET | Freq: Every day | ORAL | Status: DC
  Administered 2021-12-22 – 2021-12-23 (×2): 81 mg via ORAL
  Filled 2021-12-21 (×2): qty 1

## 2021-12-21 MED ORDER — HEPARIN (PORCINE) 25000 UT/250ML-% IV SOLN
1600.0000 [IU]/h | INTRAVENOUS | Status: DC
  Administered 2021-12-21: 1600 [IU]/h via INTRAVENOUS
  Filled 2021-12-21: qty 250

## 2021-12-21 MED ORDER — HEPARIN SODIUM (PORCINE) 1000 UNIT/ML IJ SOLN
INTRAMUSCULAR | Status: AC
Start: 2021-12-21 — End: ?
  Filled 2021-12-21: qty 10

## 2021-12-21 MED ORDER — FAMOTIDINE 20 MG PO TABS
20.0000 mg | ORAL_TABLET | Freq: Once | ORAL | Status: AC
  Administered 2021-12-21: 20 mg via ORAL
  Filled 2021-12-21: qty 1

## 2021-12-21 MED ORDER — FENTANYL CITRATE (PF) 100 MCG/2ML IJ SOLN
INTRAMUSCULAR | Status: AC
  Filled 2021-12-21: qty 2

## 2021-12-21 MED ORDER — ONDANSETRON HCL 4 MG/2ML IJ SOLN
INTRAMUSCULAR | Status: DC | PRN
  Administered 2021-12-21: 4 mg via INTRAVENOUS

## 2021-12-21 MED ORDER — ATORVASTATIN CALCIUM 80 MG PO TABS
80.0000 mg | ORAL_TABLET | Freq: Every day | ORAL | Status: DC
  Administered 2021-12-21 – 2021-12-23 (×3): 80 mg via ORAL
  Filled 2021-12-21 (×3): qty 1

## 2021-12-21 MED ORDER — VERAPAMIL HCL 2.5 MG/ML IV SOLN
INTRAVENOUS | Status: DC | PRN
  Administered 2021-12-21: 10 mL via INTRA_ARTERIAL

## 2021-12-21 MED ORDER — LIDOCAINE VISCOUS HCL 2 % MT SOLN
15.0000 mL | Freq: Once | OROMUCOSAL | Status: AC
Start: 2021-12-21 — End: 2021-12-21
  Administered 2021-12-21: 15 mL via ORAL
  Filled 2021-12-21: qty 15

## 2021-12-21 MED ORDER — ACETAMINOPHEN 325 MG PO TABS
650.0000 mg | ORAL_TABLET | ORAL | Status: DC | PRN
  Administered 2021-12-22: 650 mg via ORAL
  Filled 2021-12-21: qty 2

## 2021-12-21 MED ORDER — HYDRALAZINE HCL 20 MG/ML IJ SOLN: 10.0000 mg | INTRAMUSCULAR | Status: AC | PRN

## 2021-12-21 MED ORDER — TIROFIBAN (AGGRASTAT) BOLUS VIA INFUSION
INTRAVENOUS | Status: DC | PRN
  Administered 2021-12-21: 2777.5 ug via INTRAVENOUS

## 2021-12-21 MED ORDER — ONDANSETRON HCL 4 MG/2ML IJ SOLN
INTRAMUSCULAR | Status: AC
  Filled 2021-12-21: qty 2

## 2021-12-21 MED ORDER — FAMOTIDINE IN NACL 20-0.9 MG/50ML-% IV SOLN
INTRAVENOUS | Status: AC
  Filled 2021-12-21: qty 50

## 2021-12-21 MED ORDER — AMIODARONE HCL IN DEXTROSE 360-4.14 MG/200ML-% IV SOLN
30.0000 mg/h | INTRAVENOUS | Status: DC
  Administered 2021-12-22: 30 mg/h via INTRAVENOUS
  Filled 2021-12-21 (×2): qty 200

## 2021-12-21 MED ORDER — IOHEXOL 350 MG/ML SOLN
100.0000 mL | Freq: Once | INTRAVENOUS | Status: AC | PRN
Start: 2021-12-21 — End: 2021-12-21
  Administered 2021-12-21: 100 mL via INTRAVENOUS

## 2021-12-21 MED ORDER — SODIUM CHLORIDE 0.9% FLUSH
3.0000 mL | Freq: Two times a day (BID) | INTRAVENOUS | Status: DC
  Administered 2021-12-22 – 2021-12-23 (×3): 3 mL via INTRAVENOUS

## 2021-12-21 MED ORDER — SODIUM CHLORIDE 0.9 % IV SOLN: INTRAVENOUS | Status: AC

## 2021-12-21 MED ORDER — TICAGRELOR 90 MG PO TABS
ORAL_TABLET | ORAL | Status: DC | PRN
  Administered 2021-12-21: 180 mg via ORAL

## 2021-12-21 MED ORDER — TICAGRELOR 90 MG PO TABS
90.0000 mg | ORAL_TABLET | Freq: Two times a day (BID) | ORAL | Status: DC
  Administered 2021-12-21 – 2021-12-23 (×4): 90 mg via ORAL
  Filled 2021-12-21 (×4): qty 1

## 2021-12-21 MED ORDER — SODIUM CHLORIDE 0.9 % IV SOLN: 250.0000 mL | INTRAVENOUS | Status: DC | PRN

## 2021-12-21 MED ORDER — LABETALOL HCL 5 MG/ML IV SOLN: 10.0000 mg | INTRAVENOUS | Status: AC | PRN

## 2021-12-21 MED ORDER — MAGNESIUM SULFATE 2 GM/50ML IV SOLN
2.0000 g | Freq: Once | INTRAVENOUS | Status: AC
Start: 2021-12-21 — End: 2021-12-21
  Administered 2021-12-21: 2 g via INTRAVENOUS
  Filled 2021-12-21: qty 50

## 2021-12-21 MED ORDER — ALUM & MAG HYDROXIDE-SIMETH 200-200-20 MG/5ML PO SUSP
30.0000 mL | Freq: Once | ORAL | Status: AC
Start: 2021-12-21 — End: 2021-12-21
  Administered 2021-12-21: 30 mL via ORAL
  Filled 2021-12-21: qty 30

## 2021-12-21 MED ORDER — LEVOTHYROXINE SODIUM 88 MCG PO TABS
88.0000 ug | ORAL_TABLET | Freq: Every day | ORAL | Status: DC
  Administered 2021-12-22 – 2021-12-23 (×2): 88 ug via ORAL
  Filled 2021-12-21 (×3): qty 1

## 2021-12-21 SURGICAL SUPPLY — 23 items
BALL SAPPHIRE NC24 3.25X18 (BALLOONS) ×1
BALL SAPPHIRE NC24 3.5X18 (BALLOONS) ×1
BALLN SAPPHIRE 2.0X12 (BALLOONS) ×1
BALLOON SAPPHIRE 2.0X12 (BALLOONS) IMPLANT
BALLOON SAPPHIRE NC24 3.25X18 (BALLOONS) IMPLANT
BALLOON SAPPHIRE NC24 3.5X18 (BALLOONS) IMPLANT
CATH 5FR JL3.5 JR4 ANG PIG MP (CATHETERS) IMPLANT
CATH VISTA GUIDE 6FR XB3 (CATHETERS) IMPLANT
DEVICE RAD COMP TR BAND LRG (VASCULAR PRODUCTS) IMPLANT
GLIDESHEATH SLEND SS 6F .021 (SHEATH) IMPLANT
GUIDEWIRE INQWIRE 1.5J.035X260 (WIRE) IMPLANT
INQWIRE 1.5J .035X260CM (WIRE) ×1
KIT ENCORE 26 ADVANTAGE (KITS) IMPLANT
KIT HEART LEFT (KITS) ×1 IMPLANT
PACK CARDIAC CATHETERIZATION (CUSTOM PROCEDURE TRAY) ×1 IMPLANT
STENT SYNERGY XD 3.0X24 (Permanent Stent) IMPLANT
STENT SYNERGY XD 3.50X28 (Permanent Stent) IMPLANT
SYNERGY XD 3.0X24 (Permanent Stent) ×1 IMPLANT
SYNERGY XD 3.50X28 (Permanent Stent) ×1 IMPLANT
SYR MEDRAD MARK 7 150ML (SYRINGE) ×1 IMPLANT
TRANSDUCER W/STOPCOCK (MISCELLANEOUS) ×1 IMPLANT
TUBING CIL FLEX 10 FLL-RA (TUBING) ×1 IMPLANT
WIRE COUGAR XT STRL 190CM (WIRE) IMPLANT

## 2021-12-21 NOTE — ED Provider Notes (Signed)
[facility] EMERGENCY DEPT Provider Note   CSN: [id number] Arrival date & time: 12/21/21  0913     History  Chief Complaint  Patient presents with   Chest Pain    Todd [name] is a 46 y.o. male presenting emergency department with complaint of chest pain.  He reports acute onset of discomfort in his chest which he describes as a burning sensation substernal around 7 AM.  He had really been awake and was trying to pack up the kids for school, had agitated or yelling at one of his children, began having pain in his substernal chest.  It radiates towards his back.  He says it feels like he is having pressure being punched in the back.  He has never had this sensation before.  He try to take Pepto-Bismol with no relief.  He went to urgent care and subsequently was referred into the ED for further evaluation.  He reports the pain has been more or less persistent, it will go away for maybe a minute or 2 and then return.  He does feel mildly nauseous.  No vomiting.  No diarrhea or constipation.  He reports he typically does not have high blood pressure.  His blood pressure is always quite normal in the office and his annual exams.  He reports he has some borderline high cholesterol and takes a statin for this.  He denies history of diabetes.  He does not smoke.  He denies any significant family history of MI or aneurysm.  HPI     Home Medications Prior to Admission medications   Medication Sig Start Date End Date Taking? Authorizing Provider  levothyroxine (SYNTHROID, LEVOTHROID) 88 MCG tablet Take 88 mcg by mouth daily before breakfast.    [provider]      Allergies    Tretinoin    Review of Systems   Review of Systems  Physical Exam Updated Vital Signs BP 114/76   Pulse (!) 0   Temp 98.6 F (37 C)   Resp (!) 23   Ht 6' (1.829 m)   Wt 111.1 kg   SpO2 99%   BMI 33.23 kg/m  Physical Exam Constitutional:      General: He is not in acute  distress. HENT:     Head: Normocephalic and atraumatic.  Eyes:     Conjunctiva/sclera: Conjunctivae normal.     Pupils: Pupils are equal, round, and reactive to light.  Cardiovascular:     Rate and Rhythm: Normal rate and regular rhythm.  Pulmonary:     Effort: Pulmonary effort is normal. No respiratory distress.  Abdominal:     General: There is no distension.     Tenderness: There is no abdominal tenderness.  Skin:    General: Skin is warm and dry.  Neurological:     General: No focal deficit present.     Mental Status: He is alert. Mental status is at baseline.  Psychiatric:        Mood and Affect: Mood normal.        Behavior: Behavior normal.     ED Results / Procedures / Treatments   Labs (all labs ordered are listed, but only abnormal results are displayed) Labs Reviewed  BASIC METABOLIC PANEL - Abnormal; Notable for the following components:      Result Value   Glucose, Bld 106 (*)    Calcium 8.7 (*)    All other components within normal limits  CBC - Abnormal; Notable for the following components:  Hemoglobin 17.5 (*)    All other components within normal limits  TROPONIN I (HIGH SENSITIVITY) - Abnormal; Notable for the following components:   Troponin I (High Sensitivity) 27 (*)    All other components within normal limits  SARS CORONAVIRUS 2 BY RT PCR  MAGNESIUM  TROPONIN I (HIGH SENSITIVITY)    EKG EKG Interpretation  Date/Time:  Thursday December 21 2021 09:18:25 EDT Ventricular Rate:  68 PR Interval:  160 QRS Duration: 84 QT Interval:  374 QTC Calculation: 397 R Axis:   21 Text Interpretation: Normal sinus rhythm Normal ECG When compared with ECG of 15-Aug-2018 17:39, Abberant conduction is no longer Present QT has shortened Confirmed by Octaviano Glow [id number]) on 12/21/2021 9:19:46 AM  Radiology CARDIAC CATHETERIZATION  Result Date: 12/21/2021   Prox RCA lesion is 20% stenosed.   Prox Cx lesion is 100% stenosed.   Mid LAD lesion is 20%  stenosed.   Ost Cx to Prox Cx lesion is 80% stenosed.   A drug-eluting stent was successfully placed using a SYNERGY XD 3.0X24.   A drug-eluting stent was successfully placed using a SYNERGY XD 3.50X28.   Post intervention, there is a 0% residual stenosis.   Post intervention, there is a 0% residual stenosis. Acute lateral STEMI secondary to thrombotic occlusion of the mid Circumflex Successful PTCA/DES x 2 proximal/mid circumflex. Successful balloon angioplasty only ostium of OM2 Mild plaque in the LAD and RCA LVEDP 24 mmHg Recommendations: Will admit to [place]. Continue DAPT with ASA and Brilinta for one year. High intensity statin. Wean Levophed as tolerated. Echo tomorrow.   CT Angio Chest/Abd/Pel for Dissection W and/or Wo Contrast  Result Date: 12/21/2021 CLINICAL DATA:  Acute chest and abdominal pain. EXAM: CT ANGIOGRAPHY CHEST, ABDOMEN AND PELVIS TECHNIQUE: Non-contrast CT of the chest was initially obtained. Multidetector CT imaging through the chest, abdomen and pelvis was performed using the standard protocol during bolus administration of intravenous contrast. Multiplanar reconstructed images and MIPs were obtained and reviewed to evaluate the vascular anatomy. RADIATION DOSE REDUCTION: This exam was performed according to the departmental dose-optimization program which includes automated exposure control, adjustment of the mA and/or kV according to patient size and/or use of iterative reconstruction technique. CONTRAST:  135mL OMNIPAQUE IOHEXOL 350 MG/ML SOLN COMPARISON:  CT abdomen/pelvis 10/09/2020 FINDINGS: CTA CHEST FINDINGS Cardiovascular: The heart is normal in size. No pericardial effusion. The aorta is normal in caliber. No dissection. No atherosclerotic calcifications. The branch vessels are patent. No coronary artery calcifications. The pulmonary arteries are grossly normal. Mediastinum/Nodes: No mediastinal or hilar mass or lymphadenopathy. The esophagus is unremarkable. Lungs/Pleura: No  acute pulmonary findings. No pulmonary edema, pleural effusions or pulmonary infiltrates. No interstitial lung disease or bronchiectasis. Calcified left hilar lymph nodes and left upper lobe calcified granuloma likely related to remote granulomatous disease. Musculoskeletal: No significant bony findings. Review of the MIP images confirms the above findings. CTA ABDOMEN AND PELVIS FINDINGS VASCULAR Aorta: Normal Celiac: Normal SMA: Normal Renals: Normal IMA: Normal Inflow: Normal Veins: Normal Review of the MIP images confirms the above findings. NON-VASCULAR Hepatobiliary: No hepatic lesions or intrahepatic biliary dilatation. The gallbladder is unremarkable. No common bile duct dilatation. Pancreas: No mass, inflammation or ductal dilatation. Spleen: Normal size. Small scattered calcified granulomas. Adrenals/Urinary Tract: Adrenal glands and kidneys are. The bladder is unremarkable. Stomach/Bowel: The stomach, duodenum, small bowel and colon are unremarkable. No acute inflammatory changes, mass lesions or obstructive findings. The terminal ileum and appendix are normal. Scattered colonic diverticulosis. Lymphatic: No abdominal  or pelvic lymphadenopathy. Reproductive: The prostate gland and seminal vesicles are normal. Other: No pelvic mass or adenopathy. No free pelvic fluid collections. No inguinal mass or adenopathy. No abdominal wall hernia or subcutaneous lesions. Musculoskeletal: No significant bony findings. Review of the MIP images confirms the above findings. IMPRESSION: 1. Normal thoracic and abdominal aorta. No aneurysm or dissection. 2. No acute pulmonary findings. 3. Remote granulomatous disease involving the left lung and spleen. 4. No acute abdominal/pelvic findings, mass lesions or lymphadenopathy. Electronically Signed   By: Marijo Sanes M.D.   On: 12/21/2021 11:16   DG Chest 2 View  Result Date: 12/21/2021 CLINICAL DATA:  Chest pain EXAM: CHEST - 2 VIEW COMPARISON:  08/15/2018 FINDINGS: The  heart size and mediastinal contours are within normal limits. Both lungs are clear. The visualized skeletal structures are unremarkable. IMPRESSION: No active cardiopulmonary disease. Electronically Signed   By: Elmer Picker M.D.   On: 12/21/2021 10:00    Procedures .Critical Care  Performed by: Wyvonnia Dusky, MD Authorized by: Wyvonnia Dusky, MD   Critical care provider statement:    Critical care time (minutes):  45   Critical care time was exclusive of:  Separately billable procedures and treating other patients   Critical care was necessary to treat or prevent imminent or life-threatening deterioration of the following conditions:  Circulatory failure   Critical care was time spent personally by me on the following activities:  Ordering and performing treatments and interventions, ordering and review of laboratory studies, ordering and review of radiographic studies, pulse oximetry, review of old charts, examination of patient and evaluation of patient's response to treatment   Care discussed with: admitting provider   Comments:     Heparin for STEMI Amiodarone for V Fib .Cardioversion  Date/Time: 12/21/2021 12:53 PM  Performed by: Wyvonnia Dusky, MD Authorized by: Wyvonnia Dusky, MD   Consent:    Consent obtained:  Emergent situation Pre-procedure details:    Cardioversion basis:  Emergent   Rhythm:  Ventricular tachycardia   Electrode placement:  Anterior-posterior Patient sedated: No Attempt one:    Cardioversion mode:  Synchronous   Waveform:  Biphasic   Shock (Joules):  200   Shock outcome:  Conversion to normal sinus rhythm     Medications Ordered in ED Medications  amiodarone (NEXTERONE) 1.8 mg/mL load via infusion 150 mg (150 mg Intravenous Bolus from Bag 12/21/21 1214)    Followed by  amiodarone (NEXTERONE PREMIX) 360-4.14 MG/200ML-% (1.8 mg/mL) IV infusion (60 mg/hr Intravenous New Bag/Given 12/21/21 1215)    Followed by  amiodarone (NEXTERONE  PREMIX) 360-4.14 MG/200ML-% (1.8 mg/mL) IV infusion (has no administration in time range)  heparin ADULT infusion 100 units/mL (25000 units/253mL) (1,600 Units/hr Intravenous New Bag/Given 12/21/21 1222)  alum & mag hydroxide-simeth (MAALOX/MYLANTA) 200-200-20 MG/5ML suspension 30 mL (30 mLs Oral Given 12/21/21 1041)    And  lidocaine (XYLOCAINE) 2 % viscous mouth solution 15 mL (15 mLs Oral Given 12/21/21 1041)  famotidine (PEPCID) tablet 20 mg (20 mg Oral Given 12/21/21 1041)  iohexol (OMNIPAQUE) 350 MG/ML injection 100 mL (100 mLs Intravenous Contrast Given 12/21/21 1059)  ketorolac (TORADOL) 30 MG/ML injection 30 mg (30 mg Intravenous Given 12/21/21 1137)  EPINEPHrine (ADRENALIN) 1 MG/10ML injection (0.1 mg Intravenous Given 12/21/21 1150)  magnesium sulfate IVPB 2 g 50 mL (0 g Intravenous Stopped 12/21/21 1339)  heparin bolus via infusion 4,000 Units (4,000 Units Intravenous Bolus from Bag 12/21/21 1223)  0.9 %  sodium chloride infusion (100  mL/hr Intravenous New Bag/Given 12/21/21 1322)  norepinephrine (LEVOPHED) 4 mg in dextrose 5 % 250 mL (0.016 mg/mL) infusion (5 mcg/min Intravenous New Bag/Given 12/21/21 1327)  tirofiban (AGGRASTAT) infusion 50 mcg/mL 100 mL (0.075 mcg/kg/min  111.1 kg Intravenous New Bag/Given 12/21/21 1330)    ED Course/ Medical Decision Making/ A&P Clinical Course as of 12/21/21 1436  Thu Dec 21, 2021  1156 Called to the room as the patient was suddenly ill.  The patient was pale on my arrival, appeared to be foaming near the mouth, clammy.  He was not responding to voice commands.  Telemetry appeared to show polymorphic ventricular tachycardia.  Defibrillator was brought over and the patient underwent cardioversion at 200 J.  Immediately after shock was delivered, there was a prolonged sinus pause, and CPR was initiated for approximately 30 seconds, before the patient was subsequently found to have a brisk pulse, and was murmuring and then gradually able to respond to questions.  Within 2  minutes he appears to have converted himself back into a regular sinus rhythm, and was awake and mentating well and answering questions. [MT]  1159 Repeat EKG now shows a lateral STEMI, ST elevations in V5 V6, ST depressions in V2 and V3.  Code STEMI activated.  Heparin ordered. [MT]  1205 I spoke to Dr Dicie Beam from cardiology who agrees with Code Stemi they will take pt to cath lab upon arrival at [facility].  [facility] notified for emergent transfer [MT]  1210 Review of telemetry shows torsades type pattern.  I have ordered magnesium [MT]  1252 Patient gone with EMS, BP stable with IV running - held nitro given low BP and amiodarone infusion [MT]    Clinical Course User Index [MT] Bosco Paparella, Kermit Balo, MD                           Medical Decision Making Amount and/or Complexity of Data Reviewed Labs: ordered. Radiology: ordered.  Risk OTC drugs. Prescription drug management.   This patient presents to the Emergency Department with complaint of chest pain. This involves an extensive number of treatment options, and is a complaint that carries with it a high risk of complications and morbidity, given the patient's comorbidity, including high cholesterol.The differential diagnosis includes ACS vs Pneumothorax vs Reflux/Gastritis vs MSK pain vs Pneumonia vs other.  I felt PE was less likely given that he has no acute risk factors, no tachypnea, no hypoxia, no tachycardia.  I felt it was reasonable to proceed with a dissection study given the sudden onset of his symptoms or radiation between the shoulder blades.  He is somewhat hypertensive here.  His heart rate however is within normal limits.  His higher blood pressure may be a stress response.  I ordered, reviewed, and interpreted labs.  Pertinent results include initial troponin negative, subsequent troponin mildly elevated. I ordered medications for possible gastritis or reflux.  The patient did not want any stronger pain medications at this  time. I ordered imaging studies which included x-ray of the chest, CT dissection study I independently visualized and interpreted imaging which showed no acute dissection or acute pathological findings on CT imaging and the monitor tracing which showed initially normal sinus rhythm -subsequently patient went into polymorphic V. tach, was defibrillated back into sinus rhythm. I personally reviewed the patients ECG which showed sinus rhythm with no acute ischemic findings initially.  However after subsequent V-fib and V. tach episode with defibrillation, repeat EKG showed developing  lateral ST elevations consistent with STEMI.           Final Clinical Impression(s) / ED Diagnoses Final diagnoses:  ST elevation myocardial infarction (STEMI), unspecified artery ([place])  Torsades de pointes [facility])    Rx / DC Orders ED Discharge Orders     None         Wyvonnia Dusky, MD 12/21/21 1437

## 2021-12-21 NOTE — ED Notes (Signed)
DR Alaira Level Fu paged 11:55 AOM

## 2021-12-21 NOTE — Progress Notes (Signed)
ANTICOAGULATION CONSULT NOTE - Initial Consult  Pharmacy Consult for Heparin Indication: chest pain/ACS  Allergies  Allergen Reactions   Tretinoin Rash    redness    Patient Measurements: Height: 6' (182.9 cm) Weight: 111.1 kg (245 lb) IBW/kg (Calculated) : 77.6 Heparin Dosing Weight: 101 kg  Vital Signs: Temp: 98.6 F (37 C) (09/07 0921) BP: 136/99 (09/07 1130) Pulse Rate: 66 (09/07 1130)  Labs: Recent Labs    12/21/21 0932  HGB 17.5*  HCT 50.8  PLT 241  CREATININE 1.03  TROPONINIHS 9    Estimated Creatinine Clearance: 112.9 mL/min (by C-G formula based on SCr of 1.03 mg/dL).   Medical History: Past Medical History:  Diagnosis Date   Thyroid disease     Medications:  Awaiting electronic med rec  Assessment: 46 y.o. M presents with CP. Pt into vtach/vfib 9/7 at [facility] ED - CPR started and ROSC achieved. To start heparin for ACS. Now code stemi called. No AC PTA.   Goal of Therapy:  Heparin level 0.3-0.7 units/ml Monitor platelets by anticoagulation protocol: Yes   Plan:  Heparin IV bolus 4000 units Heparin gtt at 1400 units/hr Will f/u post cath   Christoper Fabian, PharmD, BCPS Please see amion for complete clinical pharmacist phone list 12/21/2021,12:06 PM

## 2021-12-21 NOTE — Code Documentation (Addendum)
Pulses palpated. Pt awake and vomiting. Mouth suctionion. Pt speaking and alert. downtime. Total x1 Epi and x1 defib during arrest.

## 2021-12-21 NOTE — ED Triage Notes (Signed)
Pt arrived POV. Pt caox4 and ambulatory. Pt c/o CP  that started approx 7am today. Pain is described as burning in the central chest radiating to the back between the shoulder blades. Pt went to urgent care who recommended he go to the ED for further eval.

## 2021-12-21 NOTE — H&P (Signed)
Cardiology Admission History and Physical   Patient ID: Todd [name] MRN: [id number]; DOB: September 27, 1975   Admission date: 12/21/2021  PCP:  Vivien Presto, MD   [facility] Providers Cardiologist:  None        Chief Complaint:  Chest pain/Cardiac arrest  History of Present Illness:   Todd [name] is a 46 yo male with history of hyperlipidemia, hypothyroidism and obesity who presented to the [facility] ED today with c/o chest pain. His pain began around 7 am while trying to get his kids ready for schoool. Initial EKG without ischemic changes. Troponin 9 up to 27. Chest CTA without evidence of aortic dissection or other aortic pathology. While being observed in the ED around 11:45 am, he had cardiac arrest with Torsaded/ventricular fibrillation reported by the ED staff. This was 10 minutes after receiving IV Toradol. He was resuscitated with CPR, one defibrillation and epi with return to ROSC within several minutes. EKG post arrest with lateral ST elevation. He was given IV heparin, IV Magnesium and IV amiodarone. Code STEMI was called and he was transported to [facility] for emergent cardiac cath. Pt arrived at [facility] with c/o ongoing chest pain.    Past Medical History:  Diagnosis Date   Hyperlipidemia    Thyroid disease     Past Surgical History:  Procedure Laterality Date   EYE SURGERY       Medications Prior to Admission: Prior to Admission medications   Medication Sig Start Date End Date Taking? Authorizing Provider  levothyroxine (SYNTHROID, LEVOTHROID) 88 MCG tablet Take 88 mcg by mouth daily before breakfast.    [provider]     Allergies:    Allergies  Allergen Reactions   Tretinoin Rash    redness    Social History:   Social History   Socioeconomic History   Marital status: Married    Spouse name: Not on file   Number of children: Not on file   Years of education: Not on file   Highest education level: Not on file  Occupational History    Not on file  Tobacco Use   Smoking status: Never   Smokeless tobacco: Not on file  Substance and Sexual Activity   Alcohol use: No   Drug use: No   Sexual activity: Not on file  Other Topics Concern   Not on file  Social History Narrative   Not on file   Social Determinants of Health   Financial Resource Strain: Not on file  Food Insecurity: Not on file  Transportation Needs: Not on file  Physical Activity: Not on file  Stress: Not on file  Social Connections: Not on file  Intimate Partner Violence: Not on file    Family History:   The patient's family history includes Heart attack (age of onset: 33) in his mother.    ROS:  Please see the history of present illness.  All other ROS reviewed and negative.     Physical Exam/Data:   Vitals:   12/21/21 1212 12/21/21 1215 12/21/21 1219 12/21/21 1224  BP: 92/60 99/68 (!) 79/45 90/63  Pulse: 87 86 86 80  Resp: 16 15 19 17   Temp:      SpO2: 100% 100% 99% 98%  Weight:      Height:       No intake or output data in the 24 hours ending 12/21/21 1305    12/21/2021    9:27 AM 08/15/2018    5:57 PM  Last 3 Weights  Weight (lbs) 245 lb 270 lb  Weight (kg) 111.131 kg 122.471 kg     Body mass index is 33.23 kg/m.  General:  Well nourished, well developed, appears uncomfortable HEENT: normal Neck: no JVD Vascular: No carotid bruits; Distal pulses 2+ bilaterally   Cardiac:  normal S1, S2; RRR; no murmur  Lungs:  clear to auscultation bilaterally, no wheezing, rhonchi or rales  Abd: soft, nontender, no hepatomegaly  Ext: no LE edema Musculoskeletal:  No deformities, BUE and BLE strength normal and equal Skin: warm and dry  Neuro:  CNs 2-12 intact, no focal abnormalities noted Psych:  Normal affect    EKG:  The ECG that was done was personally reviewed and demonstrates sinus rhythm with lateral ST elevation c/w acute MI  Laboratory Data:  High Sensitivity Troponin:   Recent Labs  Lab 12/21/21 0932 12/21/21 1120   TROPONINIHS 9 27*      Chemistry Recent Labs  Lab 12/21/21 0932  NA 138  K 4.1  CL 106  CO2 23  GLUCOSE 106*  BUN 20  CREATININE 1.03  CALCIUM 8.7*  GFRNONAA >60  ANIONGAP 9    No results for input(s): "PROT", "ALBUMIN", "AST", "ALT", "ALKPHOS", "BILITOT" in the last 168 hours. Lipids No results for input(s): "CHOL", "TRIG", "HDL", "LABVLDL", "LDLCALC", "CHOLHDL" in the last 168 hours. Hematology Recent Labs  Lab 12/21/21 0932  WBC 5.6  RBC 5.46  HGB 17.5*  HCT 50.8  MCV 93.0  MCH 32.1  MCHC 34.4  RDW 12.7  PLT 241   Thyroid No results for input(s): "TSH", "FREET4" in the last 168 hours. BNPNo results for input(s): "BNP", "PROBNP" in the last 168 hours.  DDimer No results for input(s): "DDIMER" in the last 168 hours.   Radiology/Studies:  CT Angio Chest/Abd/Pel for Dissection W and/or Wo Contrast  Result Date: 12/21/2021 CLINICAL DATA:  Acute chest and abdominal pain. EXAM: CT ANGIOGRAPHY CHEST, ABDOMEN AND PELVIS TECHNIQUE: Non-contrast CT of the chest was initially obtained. Multidetector CT imaging through the chest, abdomen and pelvis was performed using the standard protocol during bolus administration of intravenous contrast. Multiplanar reconstructed images and MIPs were obtained and reviewed to evaluate the vascular anatomy. RADIATION DOSE REDUCTION: This exam was performed according to the departmental dose-optimization program which includes automated exposure control, adjustment of the mA and/or kV according to patient size and/or use of iterative reconstruction technique. CONTRAST:  OMNIPAQUE IOHEXOL 350 MG/ML SOLN COMPARISON:  CT abdomen/pelvis 10/09/2020 FINDINGS: CTA CHEST FINDINGS Cardiovascular: The heart is normal in size. No pericardial effusion. The aorta is normal in caliber. No dissection. No atherosclerotic calcifications. The branch vessels are patent. No coronary artery calcifications. The pulmonary arteries are grossly normal.  Mediastinum/Nodes: No mediastinal or hilar mass or lymphadenopathy. The esophagus is unremarkable. Lungs/Pleura: No acute pulmonary findings. No pulmonary edema, pleural effusions or pulmonary infiltrates. No interstitial lung disease or bronchiectasis. Calcified left hilar lymph nodes and left upper lobe calcified granuloma likely related to remote granulomatous disease. Musculoskeletal: No significant bony findings. Review of the MIP images confirms the above findings. CTA ABDOMEN AND PELVIS FINDINGS VASCULAR Aorta: Normal Celiac: Normal SMA: Normal Renals: Normal IMA: Normal Inflow: Normal Veins: Normal Review of the MIP images confirms the above findings. NON-VASCULAR Hepatobiliary: No hepatic lesions or intrahepatic biliary dilatation. The gallbladder is unremarkable. No common bile duct dilatation. Pancreas: No mass, inflammation or ductal dilatation. Spleen: Normal size. Small scattered calcified granulomas. Adrenals/Urinary Tract: Adrenal glands and kidneys are. The bladder is unremarkable. Stomach/Bowel: The  stomach, duodenum, small bowel and colon are unremarkable. No acute inflammatory changes, mass lesions or obstructive findings. The terminal ileum and appendix are normal. Scattered colonic diverticulosis. Lymphatic: No abdominal or pelvic lymphadenopathy. Reproductive: The prostate gland and seminal vesicles are normal. Other: No pelvic mass or adenopathy. No free pelvic fluid collections. No inguinal mass or adenopathy. No abdominal wall hernia or subcutaneous lesions. Musculoskeletal: No significant bony findings. Review of the MIP images confirms the above findings. IMPRESSION: 1. Normal thoracic and abdominal aorta. No aneurysm or dissection. 2. No acute pulmonary findings. 3. Remote granulomatous disease involving the left lung and spleen. 4. No acute abdominal/pelvic findings, mass lesions or lymphadenopathy. Electronically Signed   By: Marijo Sanes M.D.   On: 12/21/2021 11:16   DG Chest 2  View  Result Date: 12/21/2021 CLINICAL DATA:  Chest pain EXAM: CHEST - 2 VIEW COMPARISON:  08/15/2018 FINDINGS: The heart size and mediastinal contours are within normal limits. Both lungs are clear. The visualized skeletal structures are unremarkable. IMPRESSION: No active cardiopulmonary disease. Electronically Signed   By: Elmer Picker M.D.   On: 12/21/2021 10:00     Assessment and Plan:   Cardiac arrest/Acute lateral STEMI: Will plan emergent cardiac cath. Further plans to follow after his cath.    Risk Assessment/Risk Scores:  {  Severity of Illness: The appropriate patient status for this patient is INPATIENT. Inpatient status is judged to be reasonable and necessary in order to provide the required intensity of service to ensure the patient's safety. The patient's presenting symptoms, physical exam findings, and initial radiographic and laboratory data in the context of their chronic comorbidities is felt to place them at high risk for further clinical deterioration. Furthermore, it is not anticipated that the patient will be medically stable for discharge from the hospital within 2 midnights of admission.   * I certify that at the point of admission it is my clinical judgment that the patient will require inpatient hospital care spanning beyond 2 midnights from the point of admission due to high intensity of service, high risk for further deterioration and high frequency of surveillance required.*   For questions or updates, please contact [place] Please consult [email or web address] for contact info under     Signed, Lauree Chandler, MD  12/21/2021 1:05 PM

## 2021-12-21 NOTE — ED Notes (Addendum)
Same as triage note. Pt currently 3/10 but the pain increases intermittently. When it increases its a 7/10.

## 2021-12-21 NOTE — ED Notes (Addendum)
Pt spontaneously went into a Torsades/VFib rhythm and lost consciousness. MD Trifan notified. CPR initiated.

## 2021-12-21 NOTE — ED Notes (Signed)
Patient given oatmeal and he is sitting comfortably

## 2021-12-21 NOTE — Code Documentation (Addendum)
CPR started. No pulses palpated.  X1 defib shock given.

## 2021-12-21 NOTE — ED Notes (Signed)
Blood drawn for troponin levels as ordered. Delivered blood to lab. Patient wants to know when he can have something to eat. Will find out from provider and let him know

## 2021-12-22 ENCOUNTER — Inpatient Hospital Stay (HOSPITAL_COMMUNITY): Payer: BC Managed Care – PPO

## 2021-12-22 ENCOUNTER — Telehealth (HOSPITAL_COMMUNITY): Payer: Self-pay

## 2021-12-22 ENCOUNTER — Other Ambulatory Visit (HOSPITAL_COMMUNITY): Payer: Self-pay

## 2021-12-22 ENCOUNTER — Other Ambulatory Visit: Payer: Self-pay

## 2021-12-22 ENCOUNTER — Encounter (HOSPITAL_COMMUNITY): Payer: Self-pay | Admitting: Cardiovascular Disease

## 2021-12-22 DIAGNOSIS — Z8674 Personal history of sudden cardiac arrest: Secondary | ICD-10-CM | POA: Diagnosis present

## 2021-12-22 DIAGNOSIS — E782 Mixed hyperlipidemia: Secondary | ICD-10-CM

## 2021-12-22 DIAGNOSIS — I469 Cardiac arrest, cause unspecified: Secondary | ICD-10-CM

## 2021-12-22 DIAGNOSIS — E785 Hyperlipidemia, unspecified: Secondary | ICD-10-CM | POA: Diagnosis present

## 2021-12-22 DIAGNOSIS — I25110 Atherosclerotic heart disease of native coronary artery with unstable angina pectoris: Secondary | ICD-10-CM

## 2021-12-22 LAB — BASIC METABOLIC PANEL
Anion gap: 9 (ref 5–15)
BUN: 13 mg/dL (ref 6–20)
CO2: 22 mmol/L (ref 22–32)
Calcium: 8.5 mg/dL — ABNORMAL LOW (ref 8.9–10.3)
Chloride: 107 mmol/L (ref 98–111)
Creatinine, Ser: 1.11 mg/dL (ref 0.61–1.24)
GFR, Estimated: >60 mL/min (ref >60)
Glucose, Bld: 100 mg/dL — ABNORMAL HIGH (ref 70–99)
Potassium: 3.6 mmol/L (ref 3.5–5.1)
Sodium: 138 mmol/L (ref 135–145)

## 2021-12-22 LAB — ECHOCARDIOGRAM COMPLETE
Area-P 1/2: 4.79 cm2
Calc EF: 53.3 %
Height: 72 in
S' Lateral: 3.7 cm
Single Plane A2C EF: 53.2 %
Single Plane A4C EF: 52.9 %
Weight: 3920 oz

## 2021-12-22 LAB — CBC
HCT: 47.9 % (ref 39.0–52.0)
Hemoglobin: 16.9 g/dL (ref 13.0–17.0)
MCH: 32.9 pg (ref 26.0–34.0)
MCHC: 35.3 g/dL (ref 30.0–36.0)
MCV: 93.4 fL (ref 80.0–100.0)
Platelets: 219 10*3/uL (ref 150–400)
RBC: 5.13 MIL/uL (ref 4.22–5.81)
RDW: 12.8 % (ref 11.5–15.5)
WBC: 11 10*3/uL — ABNORMAL HIGH (ref 4.0–10.5)
nRBC: 0 % (ref 0.0–0.2)

## 2021-12-22 LAB — HEMOGLOBIN A1C
Hgb A1c MFr Bld: 5.3 % (ref 4.8–5.6)
Mean Plasma Glucose: 105.41 mg/dL

## 2021-12-22 LAB — TSH: TSH: 3.368 u[IU]/mL (ref 0.350–4.500)

## 2021-12-22 MED ORDER — METOPROLOL TARTRATE 25 MG PO TABS
25.0000 mg | ORAL_TABLET | Freq: Two times a day (BID) | ORAL | Status: DC
  Administered 2021-12-22 (×2): 25 mg via ORAL
  Filled 2021-12-22 (×2): qty 1

## 2021-12-22 MED ORDER — POTASSIUM CHLORIDE CRYS ER 20 MEQ PO TBCR
40.0000 meq | EXTENDED_RELEASE_TABLET | Freq: Once | ORAL | Status: AC
Start: 2021-12-22 — End: 2021-12-22
  Administered 2021-12-22: 40 meq via ORAL
  Filled 2021-12-22: qty 2

## 2021-12-22 MED ORDER — CHLORHEXIDINE GLUCONATE CLOTH 2 % EX PADS
6.0000 | MEDICATED_PAD | Freq: Every day | CUTANEOUS | Status: DC
  Administered 2021-12-22: 6 via TOPICAL

## 2021-12-22 MED FILL — Midazolam HCl Inj 2 MG/2ML (Base Equivalent): INTRAMUSCULAR | Qty: 2 | Status: AC

## 2021-12-22 MED FILL — Norepinephrine-Dextrose IV Solution 4 MG/250ML-5%: INTRAVENOUS | Qty: 250 | Status: AC

## 2021-12-22 MED FILL — Fentanyl Citrate Preservative Free (PF) Inj 100 MCG/2ML: INTRAMUSCULAR | Qty: 2 | Status: AC

## 2021-12-22 NOTE — TOC Benefit Eligibility Note (Signed)
Patient Scientific laboratory technician completed.     The patient is currently admitted and upon discharge could be taking BRILINTA.   The current 30 day co-pay is, $323.13.   The patient is insured through [employer].

## 2021-12-22 NOTE — Telephone Encounter (Signed)
Pharmacy Patient Advocate Encounter  Insurance verification completed.    The patient is insured through [facility]   The patient is currently admitted and ran test claims for the following: BRILINTA 90MG .  Copays and coinsurance results were relayed to Inpatient clinical team.

## 2021-12-22 NOTE — Progress Notes (Signed)
  Transition of Care [facility]) Screening Note   Patient Details  Name: Todd [name] Date of Birth: [birth year]   Transition of Care [facility]) CM/SW Contact:    Delilah Shan, LCSWA Phone Number: 12/22/2021, 2:37 PM    Transition of Care Department [facility]) has reviewed patient and no TOC needs have been identified at this time. We will continue to monitor patient advancement through interdisciplinary progression rounds. If new patient transition needs arise, please place a TOC consult.

## 2021-12-22 NOTE — Progress Notes (Addendum)
Rounding Note    Patient Name: Todd [name] Date of Encounter: 12/22/2021  [place] Cardiologist: None Dr. Angelena Form  Subjective   Patient reports feeling improved. Some mild aching chest pain. Denies dyspnea, headache, nausea or vomiting. Able to ambulate without symptoms.   Inpatient Medications    Scheduled Meds:  aspirin  81 mg Oral Daily   atorvastatin  80 mg Oral Daily   Chlorhexidine Gluconate Cloth  6 each Topical Daily   levothyroxine  88 mcg Oral Q0600   sodium chloride flush  3 mL Intravenous Q12H   ticagrelor  90 mg Oral BID   Continuous Infusions:  sodium chloride     amiodarone 30 mg/hr (12/22/21 0700)   PRN Meds: sodium chloride, acetaminophen, sodium chloride flush   Vital Signs    Vitals:   12/22/21 0400 12/22/21 0500 12/22/21 0600 12/22/21 0700  BP: 128/87 127/85 126/88 132/87  Pulse: 68 67 66 70  Resp: 15 (!) 23 20 (!) 26  Temp:      TempSrc:      SpO2: 92% 95% 95% 95%  Weight:      Height:        Intake/Output Summary (Last 24 hours) at 12/22/2021 0955 Last data filed at 12/22/2021 0915 Gross per 24 hour  Intake 1495.93 ml  Output 1400 ml  Net 95.93 ml      12/21/2021    9:27 AM 08/15/2018    5:57 PM  Last 3 Weights  Weight (lbs) 245 lb 270 lb  Weight (kg) 111.131 kg 122.471 kg      Telemetry    NSR, 3 beats of NSVT, few PVCs - Personally Reviewed  ECG    NSR - Personally Reviewed  Physical Exam   GEN: No acute distress.   Cardiac: RRR, no murmurs, rubs, or gallops. 2+ distal pulses. Respiratory: Normal work of breathing. Clear to auscultation bilaterally. MS: No edema; No deformity. Neuro:  alert and oriented  Psych: Normal affect   Labs    High Sensitivity Troponin:   Recent Labs  Lab 12/21/21 0932 12/21/21 1120  TROPONINIHS 9 27*     Chemistry Recent Labs  Lab 12/21/21 0932 12/21/21 1159 12/22/21 0701  NA 138  --  138  K 4.1  --  3.6  CL 106  --  107  CO2 23  --  22  GLUCOSE 106*  --  100*   BUN 20  --  13  CREATININE 1.03  --  1.11  CALCIUM 8.7*  --  8.5*  MG  --  2.1  --   GFRNONAA >60  --  >60  ANIONGAP 9  --  9    Lipids  Recent Labs  Lab 12/21/21 1159  CHOL 159  TRIG 126  HDL 30*  LDLCALC 104*  CHOLHDL 5.3    Hematology Recent Labs  Lab 12/21/21 0932 12/22/21 0701  WBC 5.6 11.0*  RBC 5.46 5.13  HGB 17.5* 16.9  HCT 50.8 47.9  MCV 93.0 93.4  MCH 32.1 32.9  MCHC 34.4 35.3  RDW 12.7 12.8  PLT 241 219   Thyroid No results for input(s): "TSH", "FREET4" in the last 168 hours.  BNPNo results for input(s): "BNP", "PROBNP" in the last 168 hours.  DDimer No results for input(s): "DDIMER" in the last 168 hours.   Radiology    CARDIAC CATHETERIZATION  Result Date: 12/21/2021   Prox RCA lesion is 20% stenosed.   Prox Cx lesion is 100% stenosed.  Mid LAD lesion is 20% stenosed.   Ost Cx to Prox Cx lesion is 80% stenosed.   A drug-eluting stent was successfully placed using a SYNERGY XD 3.0X24.   A drug-eluting stent was successfully placed using a SYNERGY XD 3.50X28.   Post intervention, there is a 0% residual stenosis.   Post intervention, there is a 0% residual stenosis. Acute lateral STEMI secondary to thrombotic occlusion of the mid Circumflex Successful PTCA/DES x 2 proximal/mid circumflex. Successful balloon angioplasty only ostium of OM2 Mild plaque in the LAD and RCA LVEDP 24 mmHg Recommendations: Will admit to [place]. Continue DAPT with ASA and Brilinta for one year. High intensity statin. Wean Levophed as tolerated. Echo tomorrow.   CT Angio Chest/Abd/Pel for Dissection W and/or Wo Contrast  Result Date: 12/21/2021 CLINICAL DATA:  Acute chest and abdominal pain. EXAM: CT ANGIOGRAPHY CHEST, ABDOMEN AND PELVIS TECHNIQUE: Non-contrast CT of the chest was initially obtained. Multidetector CT imaging through the chest, abdomen and pelvis was performed using the standard protocol during bolus administration of intravenous contrast. Multiplanar reconstructed images and  MIPs were obtained and reviewed to evaluate the vascular anatomy. RADIATION DOSE REDUCTION: This exam was performed according to the departmental dose-optimization program which includes automated exposure control, adjustment of the mA and/or kV according to patient size and/or use of iterative reconstruction technique. CONTRAST:  165mL OMNIPAQUE IOHEXOL 350 MG/ML SOLN COMPARISON:  CT abdomen/pelvis 10/09/2020 FINDINGS: CTA CHEST FINDINGS Cardiovascular: The heart is normal in size. No pericardial effusion. The aorta is normal in caliber. No dissection. No atherosclerotic calcifications. The branch vessels are patent. No coronary artery calcifications. The pulmonary arteries are grossly normal. Mediastinum/Nodes: No mediastinal or hilar mass or lymphadenopathy. The esophagus is unremarkable. Lungs/Pleura: No acute pulmonary findings. No pulmonary edema, pleural effusions or pulmonary infiltrates. No interstitial lung disease or bronchiectasis. Calcified left hilar lymph nodes and left upper lobe calcified granuloma likely related to remote granulomatous disease. Musculoskeletal: No significant bony findings. Review of the MIP images confirms the above findings. CTA ABDOMEN AND PELVIS FINDINGS VASCULAR Aorta: Normal Celiac: Normal SMA: Normal Renals: Normal IMA: Normal Inflow: Normal Veins: Normal Review of the MIP images confirms the above findings. NON-VASCULAR Hepatobiliary: No hepatic lesions or intrahepatic biliary dilatation. The gallbladder is unremarkable. No common bile duct dilatation. Pancreas: No mass, inflammation or ductal dilatation. Spleen: Normal size. Small scattered calcified granulomas. Adrenals/Urinary Tract: Adrenal glands and kidneys are. The bladder is unremarkable. Stomach/Bowel: The stomach, duodenum, small bowel and colon are unremarkable. No acute inflammatory changes, mass lesions or obstructive findings. The terminal ileum and appendix are normal. Scattered colonic diverticulosis.  Lymphatic: No abdominal or pelvic lymphadenopathy. Reproductive: The prostate gland and seminal vesicles are normal. Other: No pelvic mass or adenopathy. No free pelvic fluid collections. No inguinal mass or adenopathy. No abdominal wall hernia or subcutaneous lesions. Musculoskeletal: No significant bony findings. Review of the MIP images confirms the above findings. IMPRESSION: 1. Normal thoracic and abdominal aorta. No aneurysm or dissection. 2. No acute pulmonary findings. 3. Remote granulomatous disease involving the left lung and spleen. 4. No acute abdominal/pelvic findings, mass lesions or lymphadenopathy. Electronically Signed   By: Marijo Sanes M.D.   On: 12/21/2021 11:16   DG Chest 2 View  Result Date: 12/21/2021 CLINICAL DATA:  Chest pain EXAM: CHEST - 2 VIEW COMPARISON:  08/15/2018 FINDINGS: The heart size and mediastinal contours are within normal limits. Both lungs are clear. The visualized skeletal structures are unremarkable. IMPRESSION: No active cardiopulmonary disease. Electronically Signed  By: Ernie Avena M.D.   On: 12/21/2021 10:00    Cardiac Studies   9/7 Cardiac Cath  Prox RCA lesion is 20% stenosed.   Prox Cx lesion is 100% stenosed.   Mid LAD lesion is 20% stenosed.   Ost Cx to Prox Cx lesion is 80% stenosed.   A drug-eluting stent was successfully placed using a SYNERGY XD 3.0X24.   A drug-eluting stent was successfully placed using a SYNERGY XD 3.50X28.   Post intervention, there is a 0% residual stenosis.   Post intervention, there is a 0% residual stenosis.   Acute lateral STEMI secondary to thrombotic occlusion of the mid Circumflex Successful PTCA/DES x 2 proximal/mid circumflex.  Successful balloon angioplasty only ostium of OM2 Mild plaque in the LAD and RCA LVEDP 24 mmHg  Patient Profile     46 y.o. male with hx of HLD, hypothyroidism and obesity present with acute STEMI s/p PTCA/DES x2 of prox and mid circumflex. In ED, went into cardiac arrest  with torsade/ventricular fibrillation and resuscitated with CPR, one defibrillation and epi.   Assessment & Plan    STEMI Patient presented with chest pain at ED. Troponin 9 up to 27. While in ED, he had cardiac arrest with torsades/ventricular fibrillation. He was resuscitated with CPR, one defibrillation and epi. Repeat EKG showed lateral ST elevation, V1-V4 ST depression. He was sent to [facility] for emergent cath. S/p DES x 2 of proximal and mid circumflex with balloon angioplasty only ostium of OM2.  - Of note, patient reports being on multiple herbal supplements and testosterone supplementation. Discussed stopping testosterone and other supplements. - ASA 81 mg and Brilinta 90 mg BID - atorvastatin 80 mg daily - start metoprolol 25 mg BID - pending echo today - pending TSH and A1c  - cardiac rehab - K 3.6, given 40 mEq today. Goal of K >4 and Mg >2, replete if needed, repeat BMP  HLD - LDL 103, on high dose stain   Hypothyroidism - on levothyroxine    For questions or updates, please contact [facility] Please consult [email or web address] for contact info under        Signed, Rana Snare, DO  12/22/2021, 9:55 AM     I have examined the patient and reviewed assessment and plan and discussed with patient.  Agree with above as stated.    Right radial site without hematoma.  2+ right radial pulse.  He reported that he has been making healthy lifestyle changes.  He is been having some anxiety over the past day about going to sleep due to his cardiac arrest yesterday.  I tried to reassure him that he is now revascularized and that his risk of arrhythmia is significantly decreased.  Await echo results.  Will stop IV amiodarone today.  Telemetry has been stable.  In regards to his healthy lifestyle changes, the Pharm.D. spoke with the patient and reported the following: " He is taking "a ton" of herbal supplements in his words.  He's involved in this 10x (ten-ex) herbal  supplement program where he sends blood to this company and they send him a slew of meds for what he's deficient in.  Magnesium, Zinc, and a lot I had never heard of.  Apparently he has to talk to the company's doctor every 6 months.  He was prescribed metformin for weight loss, and also takes some medicine that is "like ozempic but not ozempic" - I'm unclear if this was a prescription medicine or through this  [employer] company.  Also is on testosterone supplementation.  I recommended against all supplements that were not prescription medications for now.  He is not happy with this suggestion."  We also spoke about the dangers of excessive straining with lifting very heavy weights.  Going forward, he may be better off with more cardio type exercise and weight lifting that involves lighter weights with multiple repetitions.  He was not very happy with this suggestion either.  Plan to watch him in the ICU today.  Moved to telemetry tomorrow with a hope to discharge on Sunday.  All questions were answered for both the patient and his wife.  He needs to avoid supplemental testosterone going forward given this thrombotic cardiac event.  Lance Muss

## 2021-12-22 NOTE — Progress Notes (Signed)
  Echocardiogram 2D Echocardiogram has been performed.  Janalyn Harder 12/22/2021, 3:00 PM

## 2021-12-23 ENCOUNTER — Encounter (HOSPITAL_COMMUNITY): Payer: Self-pay | Admitting: Cardiovascular Disease

## 2021-12-23 DIAGNOSIS — Z95820 Peripheral vascular angioplasty status with implants and grafts: Secondary | ICD-10-CM

## 2021-12-23 DIAGNOSIS — I4729 Other ventricular tachycardia: Secondary | ICD-10-CM

## 2021-12-23 HISTORY — DX: Other ventricular tachycardia: I47.29

## 2021-12-23 LAB — BASIC METABOLIC PANEL
Anion gap: 8 (ref 5–15)
BUN: 12 mg/dL (ref 6–20)
CO2: 21 mmol/L — ABNORMAL LOW (ref 22–32)
Calcium: 8.7 mg/dL — ABNORMAL LOW (ref 8.9–10.3)
Chloride: 107 mmol/L (ref 98–111)
Creatinine, Ser: 1.06 mg/dL (ref 0.61–1.24)
GFR, Estimated: >60 mL/min (ref >60)
Glucose, Bld: 98 mg/dL (ref 70–99)
Potassium: 3.9 mmol/L (ref 3.5–5.1)
Sodium: 136 mmol/L (ref 135–145)

## 2021-12-23 LAB — LIPOPROTEIN A (LPA): Lipoprotein (a): 272.7 nmol/L — ABNORMAL HIGH (ref <75.0)

## 2021-12-23 MED ORDER — LEVOTHYROXINE SODIUM 88 MCG PO TABS: 88.0000 ug | ORAL_TABLET | Freq: Every day | ORAL | 1 refills | Status: DC

## 2021-12-23 MED ORDER — ATORVASTATIN CALCIUM 80 MG PO TABS: 80.0000 mg | ORAL_TABLET | Freq: Every day | ORAL | 6 refills | Status: DC

## 2021-12-23 MED ORDER — TICAGRELOR 90 MG PO TABS: 90.0000 mg | ORAL_TABLET | Freq: Two times a day (BID) | ORAL | 11 refills | Status: DC

## 2021-12-23 MED ORDER — POTASSIUM CHLORIDE 20 MEQ PO PACK
20.0000 meq | PACK | Freq: Once | ORAL | Status: AC
Start: 2021-12-23 — End: 2021-12-23
  Administered 2021-12-23: 20 meq via ORAL
  Filled 2021-12-23: qty 1

## 2021-12-23 MED ORDER — METOPROLOL SUCCINATE ER 50 MG PO TB24: 50.0000 mg | ORAL_TABLET | Freq: Every day | ORAL | 6 refills | Status: DC

## 2021-12-23 MED ORDER — METOPROLOL SUCCINATE ER 50 MG PO TB24
50.0000 mg | ORAL_TABLET | Freq: Every day | ORAL | Status: DC
Start: 2021-12-23 — End: 2021-12-23
  Administered 2021-12-23: 50 mg via ORAL
  Filled 2021-12-23: qty 1

## 2021-12-23 MED ORDER — NITROGLYCERIN 0.4 MG SL SUBL
0.4000 mg | SUBLINGUAL_TABLET | SUBLINGUAL | 3 refills | Status: DC | PRN
Start: 2021-12-23 — End: 2023-07-02

## 2021-12-23 MED ORDER — ACETAMINOPHEN 325 MG PO TABS: 650.0000 mg | ORAL_TABLET | ORAL | Status: DC | PRN

## 2021-12-23 MED ORDER — NITROGLYCERIN 0.4 MG SL SUBL: 0.4000 mg | SUBLINGUAL_TABLET | SUBLINGUAL | Status: DC | PRN

## 2021-12-23 MED ORDER — ASPIRIN 81 MG PO CHEW: 81.0000 mg | CHEWABLE_TABLET | Freq: Every day | ORAL | Status: AC

## 2021-12-23 NOTE — Progress Notes (Signed)
.  Progress Note  Patient Name: Todd [name] Date of Encounter: 12/23/2021  Primary Cardiologist: New to [facility]; [place]  Subjective   Overnight Echo done with residual wall motion abnormalities but low normal LVEF. Patient notes that he feels back to normal.  He has been ambulatory with no issues.  Very eager to return home No CP, SOB, Palpitations.  Inpatient Medications    Scheduled Meds:  aspirin  81 mg Oral Daily   atorvastatin  80 mg Oral Daily   Chlorhexidine Gluconate Cloth  6 each Topical Daily   levothyroxine  88 mcg Oral Q0600   metoprolol tartrate  25 mg Oral BID   sodium chloride flush  3 mL Intravenous Q12H   ticagrelor  90 mg Oral BID   Continuous Infusions:  sodium chloride     PRN Meds: sodium chloride, acetaminophen, sodium chloride flush   Vital Signs    Vitals:   12/23/21 0300 12/23/21 0400 12/23/21 0500 12/23/21 0600  BP: 116/89 113/85 (!) 129/95 (!) 122/91  Pulse: 63 64 63 64  Resp: 20 (!) 23 (!) 21 20  Temp:  98.5 F (36.9 C)    TempSrc:  Oral    SpO2: 94% 93% 92% 94%  Weight:      Height:        Intake/Output Summary (Last 24 hours) at 12/23/2021 0741 Last data filed at 12/23/2021 0000 Gross per 24 hour  Intake 110.06 ml  Output 1800 ml  Net -1689.94 ml   Filed Weights   12/21/21 0927  Weight: 111.1 kg    Telemetry    SR with rare PVCs - Personally Reviewed  Physical Exam   Gen: No distress   Neck: No JVD Cardiac: No Rubs or Gallops, no murmur, RRR + radial pulses Respiratory: Clear to auscultation bilaterally, normal effort, normal  respiratory rate GI: Soft, nontender, non-distended  MS: No  edema;  moves all extremities Integument: Skin feels well Neuro:  At time of evaluation, alert and oriented to person/place/time/situation  Psych: Normal affect, patient feels well   Labs    Chemistry Recent Labs  Lab 12/21/21 0932 12/22/21 0701 12/23/21 0426  NA 138 138 136  K 4.1 3.6 3.9  CL 106 107 107  CO2 23 22 21*   GLUCOSE 106* 100* 98  BUN 20 13 12   CREATININE 1.03 1.11 1.06  CALCIUM 8.7* 8.5* 8.7*  GFRNONAA >60 >60 >60  ANIONGAP 9 9 8      Hematology Recent Labs  Lab 12/21/21 0932 12/22/21 0701  WBC 5.6 11.0*  RBC 5.46 5.13  HGB 17.5* 16.9  HCT 50.8 47.9  MCV 93.0 93.4  MCH 32.1 32.9  MCHC 34.4 35.3  RDW 12.7 12.8  PLT 241 219    Cardiac EnzymesNo results for input(s): "TROPONINI" in the last 168 hours. No results for input(s): "TROPIPOC" in the last 168 hours.   BNPNo results for input(s): "BNP", "PROBNP" in the last 168 hours.   DDimer No results for input(s): "DDIMER" in the last 168 hours.   Radiology    ECHOCARDIOGRAM COMPLETE  Result Date: 12/22/2021    ECHOCARDIOGRAM REPORT   Patient Name:   Todd [name] Date of Exam: 12/22/2021 Medical Rec #:  [id number]      Height:       72.0 in Accession #:    [id number]     Weight:       245.0 lb Date of Birth:  October 24, 1975       BSA:  2.322 m Patient Age:    46 years       BP:           132/86 mmHg Patient Gender: M              HR:           62 bpm. 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                 Acute MI, Abnormal ECG, Arrythmias:Cardiac Arrest; Risk                 Factors:Dyslipidemia.  Sonographer:    Roseanna Rainbow RDCS Referring Phys: Burnell Blanks  Sonographer Comments: Technically difficult study due to poor echo windows. Image acquisition challenging due to patient body habitus. IMPRESSIONS  1. Hypokinesis of the inferolateral wall with overall low normal LV function.  2. Left ventricular ejection fraction, by estimation, is 50 to 55%. The left ventricle has low normal function. The left ventricle demonstrates regional wall motion abnormalities (see scoring  diagram/findings for description). Left ventricular diastolic  parameters are consistent with Grade I diastolic dysfunction (impaired relaxation).  3. Right ventricular systolic function is normal. The right ventricular size is normal.  4. The mitral valve is normal in structure. Trivial mitral valve regurgitation. No evidence of mitral stenosis.  5. The aortic valve is tricuspid. Aortic valve regurgitation is not visualized. Aortic valve sclerosis is present, with no evidence of aortic valve stenosis.  6. The inferior vena cava is normal in size with greater than 50% respiratory variability, suggesting right atrial pressure of 3 mmHg. Comparison(s): No prior Echocardiogram. FINDINGS  Left Ventricle: Left ventricular ejection fraction, by estimation, is 50 to 55%. The left ventricle has low normal function. The left ventricle demonstrates regional wall motion abnormalities. The left ventricular internal cavity size was normal in size. There is no left ventricular hypertrophy. Left ventricular diastolic parameters are consistent with Grade I diastolic dysfunction (impaired relaxation). Right Ventricle: The right ventricular size is normal. Right ventricular systolic function is normal. Left Atrium: Left atrial size was normal in size. Right Atrium: Right atrial size was normal in size. Pericardium: There is no evidence of pericardial effusion. Mitral Valve: The mitral valve is normal in structure. Mild mitral annular calcification. Trivial mitral valve regurgitation. No evidence of mitral valve stenosis. Tricuspid Valve: The tricuspid valve is normal in structure. Tricuspid valve regurgitation is trivial. No evidence of tricuspid stenosis. Aortic Valve: The aortic valve is tricuspid. Aortic valve regurgitation is not visualized. Aortic valve sclerosis is present, with no evidence of aortic valve stenosis. Pulmonic Valve: The pulmonic valve was normal in structure. Pulmonic valve regurgitation is not visualized. No  evidence of pulmonic stenosis. Aorta: The aortic root is normal in size and structure. Venous: The inferior vena cava is normal in size with greater than 50% respiratory variability, suggesting right atrial pressure of 3 mmHg. IAS/Shunts: No atrial level shunt detected by color flow Doppler. Additional Comments: Hypokinesis of the inferolateral wall with overall low normal LV function.  LEFT VENTRICLE PLAX 2D LVIDd:         5.10 cm     Diastology LVIDs:  3.70 cm     LV e' medial:    6.31 cm/s LV PW:         1.00 cm     LV E/e' medial:  12.5 LV IVS:        1.00 cm     LV e' lateral:   7.18 cm/s LVOT diam:     2.30 cm     LV E/e' lateral: 11.0 LV SV:         85 LV SV Index:   37 LVOT Area:     4.15 cm                             3D Volume EF: LV Volumes (MOD)           3D EF:        49 % LV vol d, MOD A2C: 69.6 ml LV EDV:       134 ml LV vol d, MOD A4C: 93.4 ml LV ESV:       69 ml LV vol s, MOD A2C: 32.6 ml LV SV:        65 ml LV vol s, MOD A4C: 44.0 ml LV SV MOD A2C:     37.0 ml LV SV MOD A4C:     93.4 ml LV SV MOD BP:      43.4 ml RIGHT VENTRICLE            IVC RV S prime:     7.65 cm/s  IVC diam: 2.20 cm TAPSE (M-mode): 1.5 cm LEFT ATRIUM             Index        RIGHT ATRIUM          Index LA diam:        3.80 cm 1.64 cm/m   RA Area:     9.02 cm LA Vol (A2C):   27.2 ml 11.71 ml/m  RA Volume:   15.40 ml 6.63 ml/m LA Vol (A4C):   20.6 ml 8.87 ml/m LA Biplane Vol: 26.4 ml 11.37 ml/m  AORTIC VALVE LVOT Vmax:   119.00 cm/s LVOT Vmean:  77.550 cm/s LVOT VTI:    0.205 m  AORTA Ao Root diam: 3.50 cm Ao Asc diam:  3.50 cm MITRAL VALVE MV Area (PHT): 4.79 cm    SHUNTS MV Decel Time: 159 msec    Systemic VTI:  0.20 m MV E velocity: 78.90 cm/s  Systemic Diam: 2.30 cm MV A velocity: 84.65 cm/s MV E/A ratio:  0.93 Olga Millers MD Electronically signed by Olga Millers MD Signature Date/Time: 12/22/2021/3:05:07 PM    Final    CARDIAC CATHETERIZATION  Result Date: 12/21/2021   Prox RCA lesion is 20% stenosed.    Prox Cx lesion is 100% stenosed.   Mid LAD lesion is 20% stenosed.   Ost Cx to Prox Cx lesion is 80% stenosed.   A drug-eluting stent was successfully placed using a SYNERGY XD 3.0X24.   A drug-eluting stent was successfully placed using a SYNERGY XD 3.50X28.   Post intervention, there is a 0% residual stenosis.   Post intervention, there is a 0% residual stenosis. Acute lateral STEMI secondary to thrombotic occlusion of the mid Circumflex Successful PTCA/DES x 2 proximal/mid circumflex. Successful balloon angioplasty only ostium of OM2 Mild plaque in the LAD and RCA LVEDP 24 mmHg Recommendations: Will admit to 2H. Continue DAPT with ASA and Brilinta for one year.  High intensity statin. Wean Levophed as tolerated. Echo tomorrow.   CT Angio Chest/Abd/Pel for Dissection W and/or Wo Contrast  Result Date: 12/21/2021 CLINICAL DATA:  Acute chest and abdominal pain. EXAM: CT ANGIOGRAPHY CHEST, ABDOMEN AND PELVIS TECHNIQUE: Non-contrast CT of the chest was initially obtained. Multidetector CT imaging through the chest, abdomen and pelvis was performed using the standard protocol during bolus administration of intravenous contrast. Multiplanar reconstructed images and MIPs were obtained and reviewed to evaluate the vascular anatomy. RADIATION DOSE REDUCTION: This exam was performed according to the departmental dose-optimization program which includes automated exposure control, adjustment of the mA and/or kV according to patient size and/or use of iterative reconstruction technique. CONTRAST:  19mL OMNIPAQUE IOHEXOL 350 MG/ML SOLN COMPARISON:  CT abdomen/pelvis 10/09/2020 FINDINGS: CTA CHEST FINDINGS Cardiovascular: The heart is normal in size. No pericardial effusion. The aorta is normal in caliber. No dissection. No atherosclerotic calcifications. The branch vessels are patent. No coronary artery calcifications. The pulmonary arteries are grossly normal. Mediastinum/Nodes: No mediastinal or hilar mass or  lymphadenopathy. The esophagus is unremarkable. Lungs/Pleura: No acute pulmonary findings. No pulmonary edema, pleural effusions or pulmonary infiltrates. No interstitial lung disease or bronchiectasis. Calcified left hilar lymph nodes and left upper lobe calcified granuloma likely related to remote granulomatous disease. Musculoskeletal: No significant bony findings. Review of the MIP images confirms the above findings. CTA ABDOMEN AND PELVIS FINDINGS VASCULAR Aorta: Normal Celiac: Normal SMA: Normal Renals: Normal IMA: Normal Inflow: Normal Veins: Normal Review of the MIP images confirms the above findings. NON-VASCULAR Hepatobiliary: No hepatic lesions or intrahepatic biliary dilatation. The gallbladder is unremarkable. No common bile duct dilatation. Pancreas: No mass, inflammation or ductal dilatation. Spleen: Normal size. Small scattered calcified granulomas. Adrenals/Urinary Tract: Adrenal glands and kidneys are. The bladder is unremarkable. Stomach/Bowel: The stomach, duodenum, small bowel and colon are unremarkable. No acute inflammatory changes, mass lesions or obstructive findings. The terminal ileum and appendix are normal. Scattered colonic diverticulosis. Lymphatic: No abdominal or pelvic lymphadenopathy. Reproductive: The prostate gland and seminal vesicles are normal. Other: No pelvic mass or adenopathy. No free pelvic fluid collections. No inguinal mass or adenopathy. No abdominal wall hernia or subcutaneous lesions. Musculoskeletal: No significant bony findings. Review of the MIP images confirms the above findings. IMPRESSION: 1. Normal thoracic and abdominal aorta. No aneurysm or dissection. 2. No acute pulmonary findings. 3. Remote granulomatous disease involving the left lung and spleen. 4. No acute abdominal/pelvic findings, mass lesions or lymphadenopathy. Electronically Signed   By: Marijo Sanes M.D.   On: 12/21/2021 11:16   DG Chest 2 View  Result Date: 12/21/2021 CLINICAL DATA:  Chest  pain EXAM: CHEST - 2 VIEW COMPARISON:  08/15/2018 FINDINGS: The heart size and mediastinal contours are within normal limits. Both lungs are clear. The visualized skeletal structures are unremarkable. IMPRESSION: No active cardiopulmonary disease. Electronically Signed   By: Elmer Picker M.D.   On: 12/21/2021 10:00     Patient Profile     46 y.o. male s/p cardiac arrest with Lateral STEMI s/p LCX PCI and OM2 POBA, complicated by residual NSVT  Assessment & Plan     CAD s/p STEMI S/p PCI; s/p arrest NSVT - continue ASA, ticagrelor for one month before consideration of decrease to plavix atorvastatin 80 mg - will consolidate to succinate 50 mg PO daily - given K 20 meq - would not start his supplements and testosterone at DC; will need a long DC about how to accomplish is supplement goals - patient is very  active prior Photographer, jujitsu, mixed Occupational hygienist) we have discussed a temporary focus on stretch and flexibility and away from contact sports; discussed the risk of bleeding and bruising during rolling or sparing  - no radial hematoma; needs to limit lifting in right arm  Hypothyroidism - continue levothyroxine  We are primary Full Code Cardiac Diet  After long discussion of risks and benefits with patient; we will plan for PM discharge if he has no further ectopy or symptoms this PM on new therapy He will need cardiology follow up   For questions or updates, please contact [facility] Please consult [email or web address] for contact info under Cardiology/STEMI.      Riley Lam, MD FASE Cardiologist [facility]  [address] [place], [address] [place], [place] [address] [phone]  7:41 AM

## 2021-12-23 NOTE — Progress Notes (Signed)
CARDIAC REHAB PHASE I   PRE:  Rate/Rhythm: 67 SR    BP: sitting 116/86    SaO2:   MODE:  Ambulation: 1110 ft   POST:  Rate/Rhythm: 72 SR    BP: sitting 136/95     SaO2:   Tolerated well without c/o CP or SOB. He ambulated 8 laps this am. DBP is somewhat elevated and we discussed monitoring at home. Pt and wife are very receptive. Discussed MI, stents, restrictions, importance of Brilinta and other meds, diet, exercise, NTG and CRPII. Will refer to [place] CRPII. He will begin walking tomorrow. [phone]  Ethelda Chick BS, ACSM-CEP 12/23/2021 2:57 PM

## 2021-12-23 NOTE — Discharge Instructions (Addendum)
Call [facility] at [phone] if any bleeding, swelling or drainage at cath site.  May shower, no tub baths for 48 hours for groin sticks. No lifting over 5 pounds for 5 days.  No Driving until seen in office  Take 1 NTG, under your tongue, while sitting.  If no relief of pain may repeat NTG, one tab every 5 minutes up to 3 tablets total over 15 minutes.  If no relief CALL 911.  If you have dizziness/lightheadness  while taking NTG, stop taking and call 911.         Heart Healthy Diet.   Keep a log of BP reading for appt and bring all meds from home to appt   Do not stop Brilinta and Asprin they keep the stent open

## 2021-12-23 NOTE — TOC CM/SW Note (Signed)
Went to bedside to provide [employer] card. However, Mr. Binns already had Brilinta card. No TOC needs identified.   Raiford Noble, MSN, RN,BSN Inpatient [facility] Case Manager [phone]

## 2021-12-23 NOTE — Discharge Summary (Addendum)
Discharge Summary    Patient ID: Todd [name] MRN: [id number]; DOB: 1975-06-23  Admit date: 12/21/2021 Discharge date: 12/23/2021  PCP:  Curly Rim, MD   [facility] Cardiologist:  Lauree Chandler, MD        Discharge Diagnoses    Principal Problem:   Acute ST elevation myocardial infarction (STEMI) of lateral wall [facility]) Active Problems:   Cardiac arrest [facility])   Mixed hyperlipidemia   S/P angioplasty with stent s/p LCX PCI and OM2 POBA 12/21/21    NSVT (nonsustained ventricular tachycardia) ([place])    Diagnostic Studies/Procedures    12/21/21 cardiac cath   Prox RCA lesion is 20% stenosed.   Prox Cx lesion is 100% stenosed.   Mid LAD lesion is 20% stenosed.   Ost Cx to Prox Cx lesion is 80% stenosed.   A drug-eluting stent was successfully placed using a SYNERGY XD 3.0X24.   A drug-eluting stent was successfully placed using a SYNERGY XD 3.50X28.   Post intervention, there is a 0% residual stenosis.   Post intervention, there is a 0% residual stenosis.   Acute lateral STEMI secondary to thrombotic occlusion of the mid Circumflex Successful PTCA/DES x 2 proximal/mid circumflex.  Successful balloon angioplasty only ostium of OM2 Mild plaque in the LAD and RCA LVEDP 24 mmHg   Recommendations: Will admit to [place]. Continue DAPT with ASA and Brilinta for one year. High intensity statin. Wean Levophed as tolerated. Echo tomorrow.    Diagnostic Dominance: Right  Intervention   _____________  ECHO 12/22/21 IMPRESSIONS     1. Hypokinesis of the inferolateral wall with overall low normal LV  function.   2. Left ventricular ejection fraction, by estimation, is 50 to 55%. The  left ventricle has low normal function. The left ventricle demonstrates  regional wall motion abnormalities (see scoring diagram/findings for  description). Left ventricular diastolic   parameters are consistent with Grade I diastolic dysfunction (impaired  relaxation).    3. Right ventricular systolic function is normal. The right ventricular  size is normal.   4. The mitral valve is normal in structure. Trivial mitral valve  regurgitation. No evidence of mitral stenosis.   5. The aortic valve is tricuspid. Aortic valve regurgitation is not  visualized. Aortic valve sclerosis is present, with no evidence of aortic  valve stenosis.   6. The inferior vena cava is normal in size with greater than 50%  respiratory variability, suggesting right atrial pressure of 3 mmHg.   Comparison(s): No prior Echocardiogram.   FINDINGS   Left Ventricle: Left ventricular ejection fraction, by estimation, is 50  to 55%. The left ventricle has low normal function. The left ventricle  demonstrates regional wall motion abnormalities. The left ventricular  internal cavity size was normal in  size. There is no left ventricular hypertrophy. Left ventricular diastolic  parameters are consistent with Grade I diastolic dysfunction (impaired  relaxation).   Right Ventricle: The right ventricular size is normal. Right ventricular  systolic function is normal.   Left Atrium: Left atrial size was normal in size.   Right Atrium: Right atrial size was normal in size.   Pericardium: There is no evidence of pericardial effusion.   Mitral Valve: The mitral valve is normal in structure. Mild mitral annular  calcification. Trivial mitral valve regurgitation. No evidence of mitral  valve stenosis.   Tricuspid Valve: The tricuspid valve is normal in structure. Tricuspid  valve regurgitation is trivial. No evidence of tricuspid stenosis.   Aortic Valve:  The aortic valve is tricuspid. Aortic valve regurgitation is  not visualized. Aortic valve sclerosis is present, with no evidence of  aortic valve stenosis.   Pulmonic Valve: The pulmonic valve was normal in structure. Pulmonic valve  regurgitation is not visualized. No evidence of pulmonic stenosis.   Aorta: The aortic root is  normal in size and structure.   Venous: The inferior vena cava is normal in size with greater than 50%  respiratory variability, suggesting right atrial pressure of 3 mmHg.   IAS/Shunts: No atrial level shunt detected by color flow Doppler.   Additional Comments: Hypokinesis of the inferolateral wall with overall  low normal LV function.  History of Present Illness     Todd [name] is a 46 y.o. male with history of hyperlipidemia, hypothyroidism and obesity presented to med center [facility]. Pain began around 7AM EKG was normal, hs trop 9 to 27.  Chest CTA without aortic dissection or other aortic pathology.  While being monitored in ER around 1145 A he had cardiac arrest with torsades and V fib.  10 min after receiving toradol IV. He was resuscitated with CPR and one shock.  Epi with return to ROSC within several min.  EKG post arrest with lateral ST elevation.  Given IV heparin , IV Mg+ and IV amiodarone.  CODE STEMI called and [facility] for emergent cath.  He still had chest pain on arrival.   Pt underwent emergent cardiac cath and found to have 100% stenosis of prox LCX.  2 DES placed prox/ mid LCX.  Residual mild plaque in LAD and RCA. Plan for ASA and Brilinta for 1 year.  High intensity statin. He was placed on levophed in Cath lab.     Hospital Course     Consultants: none   Of note, patient reports being on multiple herbal supplements and testosterone supplementation. Discussed stopping testosterone and other supplements.  Can re discuss on outpt  visit.    Levophed weaned off and pt was improved.  He mentioned he is on some med for weight loss not ozempic.  I will ask him to bring al meds to office visit.  MD discussed dangers of excessive straining with lifting heavy weights.  Cardiac rehab did see during his visit and he walked without issues.   Phase 2 recommended.   Pt seen and evaluated today by Dr. Gasper Sells and found stable for discharge.   At discharge his  K+ is 3.9 Cr 1.06  A1C is 5.3  TSH 3.368   Did the patient have an acute coronary syndrome (MI, NSTEMI, STEMI, etc) this admission?:  Yes                               AHA/ACC Clinical Performance & Quality Measures: Aspirin prescribed? - Yes ADP Receptor Inhibitor (Plavix/Clopidogrel, Brilinta/Ticagrelor or Effient/Prasugrel) prescribed (includes medically managed patients)? - Yes Beta Blocker prescribed? - Yes High Intensity Statin (Lipitor 40-80mg  or Crestor 20-40mg ) prescribed? - Yes EF assessed during THIS hospitalization? - Yes For EF <40%, was ACEI/ARB prescribed? - Not Applicable (EF >/= [id number]) For EF <40%, Aldosterone Antagonist (Spironolactone or Eplerenone) prescribed? - Not Applicable (EF >/= [id number]) Cardiac Rehab Phase II ordered (including medically managed patients)? - Yes       The patient will be scheduled for a TOC follow up appointment in 5 days.  A message has been sent to the [facility] and Scheduling Pool at the office  where the patient should be seen for follow up.  _____________  Discharge Vitals Blood pressure (!) 128/90, pulse 69, temperature 98.8 F (37.1 C), temperature source Oral, resp. rate 14, height 6' (1.829 m), weight 111.1 kg, SpO2 94 %.  Filed Weights   12/21/21 0927  Weight: 111.1 kg    Labs & Radiologic Studies    CBC Recent Labs    12/21/21 0932 12/22/21 0701  WBC 5.6 11.0*  HGB 17.5* 16.9  HCT 50.8 47.9  MCV 93.0 93.4  PLT 241 [id number]   Basic Metabolic Panel Recent Labs    12/21/21 1159 12/22/21 0701 12/23/21 0426  NA  --  138 136  K  --  3.6 3.9  CL  --  107 107  CO2  --  22 21*  GLUCOSE  --  100* 98  BUN  --  13 12  CREATININE  --  1.11 1.06  CALCIUM  --  8.5* 8.7*  MG 2.1  --   --    Liver Function Tests No results for input(s): "AST", "ALT", "ALKPHOS", "BILITOT", "PROT", "ALBUMIN" in the last 72 hours. No results for input(s): "LIPASE", "AMYLASE" in the last 72 hours. High Sensitivity Troponin:   Recent Labs  Lab  12/21/21 0932 12/21/21 1120  TROPONINIHS 9 27*    BNP Invalid input(s): "POCBNP" D-Dimer No results for input(s): "DDIMER" in the last 72 hours. Hemoglobin A1C Recent Labs    12/22/21 0701  HGBA1C 5.3   Fasting Lipid Panel Recent Labs    12/21/21 1159  CHOL 159  HDL 30*  LDLCALC 104*  TRIG 126  CHOLHDL 5.3   Thyroid Function Tests Recent Labs    12/22/21 0701  TSH 3.368   _____________  ECHOCARDIOGRAM COMPLETE  Result Date: 12/22/2021    ECHOCARDIOGRAM REPORT   Patient Name:   Erubey Suppes Date of Exam: 12/22/2021 Medical Rec #:  [id number]      Height:       72.0 in Accession #:    [id number]     Weight:       245.0 lb Date of Birth:  03/23/1976       BSA:          2.322 m Patient Age:    9 years       BP:           132/86 mmHg Patient Gender: M              HR:           62 bpm. 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                 Acute MI, Abnormal ECG, Arrythmias:Cardiac Arrest; Risk                 Factors:Dyslipidemia.  Sonographer:    Roseanna Rainbow RDCS Referring Phys: Burnell Blanks  Sonographer Comments: Technically difficult study due to poor echo windows. Image acquisition challenging due to patient body habitus. IMPRESSIONS  1. Hypokinesis of the inferolateral wall with overall low normal LV function.  2. Left ventricular ejection fraction, by estimation, is 50 to 55%. The left  ventricle has low normal function. The left ventricle demonstrates regional wall motion abnormalities (see scoring diagram/findings for description). Left ventricular diastolic  parameters are consistent with Grade I diastolic dysfunction (impaired relaxation).  3. Right ventricular systolic function is normal. The right ventricular size is  normal.  4. The mitral valve is normal in structure. Trivial mitral valve regurgitation. No evidence of mitral stenosis.  5. The aortic valve is tricuspid. Aortic valve regurgitation is not visualized. Aortic valve sclerosis is present, with no evidence of aortic valve stenosis.  6. The inferior vena cava is normal in size with greater than 50% respiratory variability, suggesting right atrial pressure of 3 mmHg. Comparison(s): No prior Echocardiogram. FINDINGS  Left Ventricle: Left ventricular ejection fraction, by estimation, is 50 to 55%. The left ventricle has low normal function. The left ventricle demonstrates regional wall motion abnormalities. The left ventricular internal cavity size was normal in size. There is no left ventricular hypertrophy. Left ventricular diastolic parameters are consistent with Grade I diastolic dysfunction (impaired relaxation). Right Ventricle: The right ventricular size is normal. Right ventricular systolic function is normal. Left Atrium: Left atrial size was normal in size. Right Atrium: Right atrial size was normal in size. Pericardium: There is no evidence of pericardial effusion. Mitral Valve: The mitral valve is normal in structure. Mild mitral annular calcification. Trivial mitral valve regurgitation. No evidence of mitral valve stenosis. Tricuspid Valve: The tricuspid valve is normal in structure. Tricuspid valve regurgitation is trivial. No evidence of tricuspid stenosis. Aortic Valve: The aortic valve is tricuspid. Aortic valve regurgitation is not visualized. Aortic valve sclerosis is present, with no evidence of aortic valve stenosis. Pulmonic Valve: The pulmonic valve was normal in structure. Pulmonic valve regurgitation is not visualized. No evidence of pulmonic stenosis. Aorta: The aortic root is normal in size and structure. Venous: The inferior vena cava is normal in size with greater than 50% respiratory variability, suggesting right atrial pressure of 3 mmHg.  IAS/Shunts: No atrial level shunt detected by color flow Doppler. Additional Comments: Hypokinesis of the inferolateral wall with overall low normal LV function.  LEFT VENTRICLE PLAX 2D LVIDd:         5.10 cm     Diastology LVIDs:         3.70 cm     LV e' medial:    6.31 cm/s LV PW:         1.00 cm     LV E/e' medial:  12.5 LV IVS:        1.00 cm     LV e' lateral:   7.18 cm/s LVOT diam:     2.30 cm     LV E/e' lateral: 11.0 LV SV:         85 LV SV Index:   37 LVOT Area:     4.15 cm                             3D Volume EF: LV Volumes (MOD)           3D EF:        49 % LV vol d, MOD A2C: 69.6 ml LV EDV:       134 ml LV vol d, MOD A4C: 93.4 ml LV ESV:       69 ml LV vol s, MOD A2C: 32.6 ml LV SV:        65 ml LV vol s, MOD A4C: 44.0 ml  LV SV MOD A2C:     37.0 ml LV SV MOD A4C:     93.4 ml LV SV MOD BP:      43.4 ml RIGHT VENTRICLE            IVC RV S prime:     7.65 cm/s  IVC diam: 2.20 cm TAPSE (M-mode): 1.5 cm LEFT ATRIUM             Index        RIGHT ATRIUM          Index LA diam:        3.80 cm 1.64 cm/m   RA Area:     9.02 cm LA Vol (A2C):   27.2 ml 11.71 ml/m  RA Volume:   15.40 ml 6.63 ml/m LA Vol (A4C):   20.6 ml 8.87 ml/m LA Biplane Vol: 26.4 ml 11.37 ml/m  AORTIC VALVE LVOT Vmax:   119.00 cm/s LVOT Vmean:  77.550 cm/s LVOT VTI:    0.205 m  AORTA Ao Root diam: 3.50 cm Ao Asc diam:  3.50 cm MITRAL VALVE MV Area (PHT): 4.79 cm    SHUNTS MV Decel Time: 159 msec    Systemic VTI:  0.20 m MV E velocity: 78.90 cm/s  Systemic Diam: 2.30 cm MV A velocity: 84.65 cm/s MV E/A ratio:  0.93 Olga Millers MD Electronically signed by Olga Millers MD Signature Date/Time: 12/22/2021/3:05:07 PM    Final    CARDIAC CATHETERIZATION  Result Date: 12/21/2021   Prox RCA lesion is 20% stenosed.   Prox Cx lesion is 100% stenosed.   Mid LAD lesion is 20% stenosed.   Ost Cx to Prox Cx lesion is 80% stenosed.   A drug-eluting stent was successfully placed using a SYNERGY XD 3.0X24.   A drug-eluting stent was successfully  placed using a SYNERGY XD 3.50X28.   Post intervention, there is a 0% residual stenosis.   Post intervention, there is a 0% residual stenosis. Acute lateral STEMI secondary to thrombotic occlusion of the mid Circumflex Successful PTCA/DES x 2 proximal/mid circumflex. Successful balloon angioplasty only ostium of OM2 Mild plaque in the LAD and RCA LVEDP 24 mmHg Recommendations: Will admit to 2H. Continue DAPT with ASA and Brilinta for one year. High intensity statin. Wean Levophed as tolerated. Echo tomorrow.   CT Angio Chest/Abd/Pel for Dissection W and/or Wo Contrast  Result Date: 12/21/2021 CLINICAL DATA:  Acute chest and abdominal pain. EXAM: CT ANGIOGRAPHY CHEST, ABDOMEN AND PELVIS TECHNIQUE: Non-contrast CT of the chest was initially obtained. Multidetector CT imaging through the chest, abdomen and pelvis was performed using the standard protocol during bolus administration of intravenous contrast. Multiplanar reconstructed images and MIPs were obtained and reviewed to evaluate the vascular anatomy. RADIATION DOSE REDUCTION: This exam was performed according to the departmental dose-optimization program which includes automated exposure control, adjustment of the mA and/or kV according to patient size and/or use of iterative reconstruction technique. CONTRAST:  OMNIPAQUE IOHEXOL 350 MG/ML SOLN COMPARISON:  CT abdomen/pelvis 10/09/2020 FINDINGS: CTA CHEST FINDINGS Cardiovascular: The heart is normal in size. No pericardial effusion. The aorta is normal in caliber. No dissection. No atherosclerotic calcifications. The branch vessels are patent. No coronary artery calcifications. The pulmonary arteries are grossly normal. Mediastinum/Nodes: No mediastinal or hilar mass or lymphadenopathy. The esophagus is unremarkable. Lungs/Pleura: No acute pulmonary findings. No pulmonary edema, pleural effusions or pulmonary infiltrates. No interstitial lung disease or bronchiectasis. Calcified left hilar lymph nodes  and left upper lobe calcified  granuloma likely related to remote granulomatous disease. Musculoskeletal: No significant bony findings. Review of the MIP images confirms the above findings. CTA ABDOMEN AND PELVIS FINDINGS VASCULAR Aorta: Normal Celiac: Normal SMA: Normal Renals: Normal IMA: Normal Inflow: Normal Veins: Normal Review of the MIP images confirms the above findings. NON-VASCULAR Hepatobiliary: No hepatic lesions or intrahepatic biliary dilatation. The gallbladder is unremarkable. No common bile duct dilatation. Pancreas: No mass, inflammation or ductal dilatation. Spleen: Normal size. Small scattered calcified granulomas. Adrenals/Urinary Tract: Adrenal glands and kidneys are. The bladder is unremarkable. Stomach/Bowel: The stomach, duodenum, small bowel and colon are unremarkable. No acute inflammatory changes, mass lesions or obstructive findings. The terminal ileum and appendix are normal. Scattered colonic diverticulosis. Lymphatic: No abdominal or pelvic lymphadenopathy. Reproductive: The prostate gland and seminal vesicles are normal. Other: No pelvic mass or adenopathy. No free pelvic fluid collections. No inguinal mass or adenopathy. No abdominal wall hernia or subcutaneous lesions. Musculoskeletal: No significant bony findings. Review of the MIP images confirms the above findings. IMPRESSION: 1. Normal thoracic and abdominal aorta. No aneurysm or dissection. 2. No acute pulmonary findings. 3. Remote granulomatous disease involving the left lung and spleen. 4. No acute abdominal/pelvic findings, mass lesions or lymphadenopathy. Electronically Signed   By: Marijo Sanes M.D.   On: 12/21/2021 11:16   DG Chest 2 View  Result Date: 12/21/2021 CLINICAL DATA:  Chest pain EXAM: CHEST - 2 VIEW COMPARISON:  08/15/2018 FINDINGS: The heart size and mediastinal contours are within normal limits. Both lungs are clear. The visualized skeletal structures are unremarkable. IMPRESSION: No active  cardiopulmonary disease. Electronically Signed   By: Elmer Picker M.D.   On: 12/21/2021 10:00    Disposition   Pt is being discharged home today in good condition.  Follow-up Plans & Appointments   Call [facility] at [phone] if any bleeding, swelling or drainage at cath site.  May shower, no tub baths for 48 hours for groin sticks. No lifting over 5 pounds for 5 days.  No Driving until seen in office  Take 1 NTG, under your tongue, while sitting.  If no relief of pain may repeat NTG, one tab every 5 minutes up to 3 tablets total over 15 minutes.  If no relief CALL 911.  If you have dizziness/lightheadness  while taking NTG, stop taking and call 911.         Heart Healthy Diet.   Keep a log of BP reading for appt and bring all meds from home to appt   Follow-up Information     Burnell Blanks, MD Follow up on 12/27/2021.   Specialty: Cardiology Why: at 8:45 AM with his PA Ermalinda Barrios Contact information: [address]  [id number] [phone]                  Discharge Medications   Allergies as of 12/23/2021       Reactions   Tretinoin Rash   redness        Medication List     TAKE these medications    acetaminophen 325 MG tablet Commonly known as: TYLENOL Take 2 tablets (650 mg total) by mouth every 4 (four) hours as needed for headache or mild pain.   aspirin 81 MG chewable tablet Chew 1 tablet (81 mg total) by mouth daily. Start taking on: December 24, 2021   atorvastatin 80 MG tablet Commonly known as: LIPITOR Take 1 tablet (80 mg total) by mouth daily.  Start taking on: December 24, 2021 What changed:  medication strength how much to take when to take this   levothyroxine 88 MCG tablet Commonly known as: SYNTHROID Take 1 tablet (88 mcg total) by mouth daily at 6 (six) AM. Start taking on: December 24, 2021 What changed:  when to take this Another medication with the  same name was removed. Continue taking this medication, and follow the directions you see here.   metFORMIN 500 MG tablet Commonly known as: GLUCOPHAGE Take 500 mg by mouth daily.   metoprolol succinate 50 MG 24 hr tablet Commonly known as: TOPROL-XL Take 1 tablet (50 mg total) by mouth daily. Take with or immediately following a meal. Start taking on: December 24, 2021   nitroGLYCERIN 0.4 MG SL tablet Commonly known as: NITROSTAT Place 1 tablet (0.4 mg total) under the tongue every 5 (five) minutes as needed for chest pain.   ticagrelor 90 MG Tabs tablet Commonly known as: BRILINTA Take 1 tablet (90 mg total) by mouth 2 (two) times daily.           Outstanding Labs/Studies   BMP  Duration of Discharge Encounter   Greater than 30 minutes including physician time.  Signed, Nada Boozer, NP 12/23/2021, 2:58 PM

## 2021-12-23 NOTE — Progress Notes (Signed)
RN discussed discharge instructions and medications with patient and wife. No further questions or concerns. Patient satisfied with all belongings returned. Removed PIVs and took patient to main entrance via wheelchair. RN witnessed patient leaving with wife who is driving him home. Patient remained stable throughout discharge process.

## 2021-12-25 NOTE — Progress Notes (Unsigned)
Cardiology Office Note:    Date:  12/27/2021   ID:  Todd [name], DOB 03/19/1976, MRN [id number]  PCP:  Vivien Presto, MD  [facility] Providers Cardiologist:  Verne Carrow, MD     Referring MD: Vivien Presto, MD   Chief Complaint:  Hospitalization Follow-up     History of Present Illness:   Todd [name] is a 46 y.o. male with history of HLD, hypothyroidism and obesity.   He presented to [facility] with chest pain and CTA no dissection. While in ER he had cardiac arrest with torsades and Vfib 10 min after receiving toradol IV.   He was resuscitated with CPR and one shock.  Epi with return to ROSC within several min.  EKG post arrest with lateral ST elevation.  Given IV heparin , IV Mg+ and IV amiodarone.  CODE STEMI called and transported to [facility] for emergent cath.  He still had chest pain on arrival.    Pt underwent emergent cardiac cath and found to have 100% stenosis of prox LCX.  2 DES placed prox/ mid LCX.  Residual mild plaque in LAD and RCA. Plan for ASA and Brilinta for 1 year.  High intensity statin. He was placed on levophed in Cath lab.     Of note, patient reports being on multiple herbal supplements and testosterone supplementation. Discussed stopping testosterone and other supplements.   Patient comes in with his wife. Many questions answered. Last week while watching his kids at the gym he became lightheaded for about 5 seconds and it scared him.  Overall he feels great. Walking 13 min twice a day. Wants to get back to martial arts, diet explained.  Went over all his supplements.    Past Medical History:  Diagnosis Date   Hyperlipidemia    NSVT (nonsustained ventricular tachycardia) (HCC) 12/23/2021   S/P angioplasty with stent s/p LCX PCI and OM2 POBA 12/21/21  12/23/2021   Thyroid disease    Current Medications: Current Meds  Medication Sig   acetaminophen (TYLENOL) 325 MG tablet Take 2 tablets (650 mg total) by mouth every 4 (four)  hours as needed for headache or mild pain.   aspirin 81 MG chewable tablet Chew 1 tablet (81 mg total) by mouth daily.   atorvastatin (LIPITOR) 80 MG tablet Take 1 tablet (80 mg total) by mouth daily.   B Complex Vitamins (B-COMPLEX/B-12 PO) Take by mouth.   Cholecalciferol (D3 5000 PO) Take by mouth.   DHEA 25 MG CAPS Take by mouth.   Diindolylmethane POWD 1 capsule by Does not apply route. 125mg    Glutathione 6 GM/30ML SOLN Inject 1 mL as directed once a week.   IPAMORELIN ACETATE IJ Inject 0.25 mg as directed.   L-METHIONINE PO Take by mouth.   Levomefolic Acid (5-MTHF PO) Take by mouth.   levothyroxine (SYNTHROID) 112 MCG tablet Take 112 mcg by mouth daily before breakfast.   MAGNESIUM PO Take by mouth.   Menaquinone-7 (K2 PO) Take by mouth.   metFORMIN (GLUCOPHAGE) 500 MG tablet Take 500 mg by mouth daily.   metoprolol succinate (TOPROL-XL) 50 MG 24 hr tablet Take 1 tablet (50 mg total) by mouth daily. Take with or immediately following a meal.   Multiple Vitamin (MULTIVITAMIN) tablet Take 1 tablet by mouth daily.   Niacinamide-Zn-Cu-Methfo-Se-Cr (NICOTINAMIDE PO) Take by mouth.   nitroGLYCERIN (NITROSTAT) 0.4 MG SL tablet Place 1 tablet (0.4 mg total) under the tongue every 5 (five) minutes as needed for chest pain.  Omega-3 Fatty Acids (OMEGA-3 PLUS PO) Take by mouth.   PRESCRIPTION MEDICATION 25 mg as directed. ENCLOMIPHENE // 4 days per week   RESVERATROL PO Take by mouth.   ticagrelor (BRILINTA) 90 MG TABS tablet Take 1 tablet (90 mg total) by mouth 2 (two) times daily.   tirzepatide [facility]) 2.5 MG/0.5ML Pen Inject 2.5 mg into the skin once a week.   zinc gluconate 50 MG tablet Take 50 mg by mouth daily.    Allergies:   Tretinoin   Social History   Tobacco Use   Smoking status: Never  Substance Use Topics   Alcohol use: No   Drug use: No    Family Hx: The patient's family history includes Heart attack (age of onset: 45) in his mother.  ROS   EKGs/Labs/Other Test  Reviewed:    EKG:  EKG is   ordered today.  The ekg ordered today demonstrates NSR, low voltage, normal EKG  Recent Labs: 12/21/2021: Magnesium 2.1 12/22/2021: Hemoglobin 16.9; Platelets 219; TSH 3.368 12/23/2021: BUN 12; Creatinine, Ser 1.06; Potassium 3.9; Sodium 136   Recent Lipid Panel Recent Labs    12/21/21 1159  CHOL 159  TRIG 126  HDL 30*  VLDL 25  LDLCALC 235*     Prior CV Studies:     12/21/21 cardiac cath   Prox RCA lesion is 20% stenosed.   Prox Cx lesion is 100% stenosed.   Mid LAD lesion is 20% stenosed.   Ost Cx to Prox Cx lesion is 80% stenosed.   A drug-eluting stent was successfully placed using a SYNERGY XD 3.0X24.   A drug-eluting stent was successfully placed using a SYNERGY XD 3.50X28.   Post intervention, there is a 0% residual stenosis.   Post intervention, there is a 0% residual stenosis.   Acute lateral STEMI secondary to thrombotic occlusion of the mid Circumflex Successful PTCA/DES x 2 proximal/mid circumflex.  Successful balloon angioplasty only ostium of OM2 Mild plaque in the LAD and RCA LVEDP 24 mmHg   Recommendations: Will admit to 2H. Continue DAPT with ASA and Brilinta for one year. High intensity statin. Wean Levophed as tolerated. Echo tomorrow.    Diagnostic Dominance: Right  Intervention    _____________  ECHO 12/22/21 IMPRESSIONS     1. Hypokinesis of the inferolateral wall with overall low normal LV  function.   2. Left ventricular ejection fraction, by estimation, is 50 to 55%. The  left ventricle has low normal function. The left ventricle demonstrates  regional wall motion abnormalities (see scoring diagram/findings for  description). Left ventricular diastolic   parameters are consistent with Grade I diastolic dysfunction (impaired  relaxation).   3. Right ventricular systolic function is normal. The right ventricular  size is normal.   4. The mitral valve is normal in structure. Trivial mitral valve  regurgitation. No  evidence of mitral stenosis.   5. The aortic valve is tricuspid. Aortic valve regurgitation is not  visualized. Aortic valve sclerosis is present, with no evidence of aortic  valve stenosis.   6. The inferior vena cava is normal in size with greater than 50%  respiratory variability, suggesting right atrial pressure of 3 mmHg.   Comparison(s): No prior Echocardiogram.   FINDINGS   Left Ventricle: Left ventricular ejection fraction, by estimation, is 50  to 55%. The left ventricle has low normal function. The left ventricle  demonstrates regional wall motion abnormalities. The left ventricular  internal cavity size was normal in  size. There is no left  ventricular hypertrophy. Left ventricular diastolic  parameters are consistent with Grade I diastolic dysfunction (impaired  relaxation).   Right Ventricle: The right ventricular size is normal. Right ventricular  systolic function is normal.   Left Atrium: Left atrial size was normal in size.   Right Atrium: Right atrial size was normal in size.   Pericardium: There is no evidence of pericardial effusion.   Mitral Valve: The mitral valve is normal in structure. Mild mitral annular  calcification. Trivial mitral valve regurgitation. No evidence of mitral  valve stenosis.   Tricuspid Valve: The tricuspid valve is normal in structure. Tricuspid  valve regurgitation is trivial. No evidence of tricuspid stenosis.   Aortic Valve: The aortic valve is tricuspid. Aortic valve regurgitation is  not visualized. Aortic valve sclerosis is present, with no evidence of  aortic valve stenosis.   Pulmonic Valve: The pulmonic valve was normal in structure. Pulmonic valve  regurgitation is not visualized. No evidence of pulmonic stenosis.   Aorta: The aortic root is normal in size and structure.   Venous: The inferior vena cava is normal in size with greater than 50%  respiratory variability, suggesting right atrial pressure of 3 mmHg.    IAS/Shunts: No atrial level shunt detected by color flow Doppler.   Additional Comments: Hypokinesis of the inferolateral wall with overall  low normal LV function.   Risk Assessment/Calculations/Metrics:              Physical Exam:    VS:  BP 124/82   Pulse 68   Ht 6' (1.829 m)   Wt 250 lb (113.4 kg)   SpO2 96%   BMI 33.91 kg/m     Wt Readings from Last 3 Encounters:  12/27/21 250 lb (113.4 kg)  12/21/21 245 lb (111.1 kg)  08/15/18 270 lb (122.5 kg)    Physical Exam  GEN: Well nourished, well developed, in no acute distress  Neck: no JVD, carotid bruits, or masses Cardiac:RRR; no murmurs, rubs, or gallops  Respiratory:  clear to auscultation bilaterally, normal work of breathing GI: soft, nontender, nondistended, + BS Ext: without cyanosis, clubbing, or edema, Good distal pulses bilaterally Neuro:  Alert and Oriented x 3,  Psych: euthymic mood, full affect       ASSESSMENT & PLAN:   No problem-specific Assessment & Plan notes found for this encounter.    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 CPR and 1 shock, epi ROSC, treated with DES p/mCfx x2, residual mild plaque in LAD and RCA. Overall doing well without angina. Brief episode of dizziness but could have been related to BP or blood sugar. Wants to get back to jujutsu but he does neck holds and can stop breathing so I asked him not to do this. He also want to go to the gym-heavy weight lifting and asked him not to. Continue to increase walking program, cardiac rehab, light weights and discuss martial arts and weight lifting with Dr. Angelena Form at next visit.  I offered referral for counseling but he wants to hold off for now.  HLD on lipitor-check labs in 6 weeks, Lipo(a) is 272 and he qualifies for the [employer] trial. He's agreeable so will have [facility] call him.  Obesity was on a lot of supplements including weight loss, DHEA and hormones. I've asked him to hold off  on these. Many are injectables. He can take B and omega 3 supplements. I went through each supplement in detail with  he and his wife.  Hypothyroidism on synthroid.      Cardiac Rehabilitation Eligibility Assessment            Dispo:  No follow-ups on file.   Medication Adjustments/Labs and Tests Ordered: Current medicines are reviewed at length with the patient today.  Concerns regarding medicines are outlined above.  Tests Ordered: Orders Placed This Encounter  Procedures   Lipid panel   EKG 12-Lead   Medication Changes: No orders of the defined types were placed in this encounter.  Sumner Boast, PA-C  12/27/2021 11:26 AM    [address], [place], [place]  [address] Phone: [phone]; Fax: [phone]

## 2021-12-26 ENCOUNTER — Telehealth: Payer: Self-pay

## 2021-12-26 NOTE — Telephone Encounter (Signed)
**Note De-Identified Todd [name] Obfuscation** Patient contacted regarding discharge from [facility] on 12/23/2021.  Patient understands to follow up with provider Jacolyn Reedy, PA-c on 12/27/2021 at 8:45 at [address], [address] in [place], [place] [address].  Patient understands discharge instructions? Yes Patient understands medications and regiment? Yes Patient understands to bring all medications to this visit? Yes  Ask patient:  Are you enrolled in My Chart: Yes  The pt reports that he is doing "very well". He states that he has been taking small walks a couple times daily without difficulty and that he checks his BP 3Xs a day and that his reading have been normal. He denies having any CP/discomfort, nausea, vomiting, diaphoresis, SOB, dizziness,or lightheadedness. He reports that his cath site looks good and is without any pain, redness, streaking, drainage, or fever.  He verified that he does have [facility]'s phone number to call if he has any questions of concerns.  He thanked me for my call.

## 2021-12-26 NOTE — Telephone Encounter (Signed)
**Note De-identified Marzetta Lanza Obfuscation** -----  **Note De-Identified Denissa Cozart Obfuscation** Message from Leone Brand, NP sent at 12/23/2021  2:45 PM EDT ----- Pt STEMI and post arrest discharged on the 12/23/21 - has appt on the 13th so please call for [facility]

## 2021-12-27 ENCOUNTER — Ambulatory Visit: Payer: BC Managed Care – PPO | Attending: Physician Assistant | Admitting: Physician Assistant

## 2021-12-27 ENCOUNTER — Encounter: Payer: Self-pay | Admitting: Physician Assistant

## 2021-12-27 VITALS — BP 124/82 | HR 68 | Ht 72.0 in | Wt 250.0 lb

## 2021-12-27 DIAGNOSIS — I2129 ST elevation (STEMI) myocardial infarction involving other sites: Secondary | ICD-10-CM | POA: Diagnosis not present

## 2021-12-27 DIAGNOSIS — E782 Mixed hyperlipidemia: Secondary | ICD-10-CM | POA: Diagnosis not present

## 2021-12-27 NOTE — Patient Instructions (Signed)
Medication Instructions:  Your physician recommends that you continue on your current medications as directed. Please refer to the Current Medication list given to you today.  *If you need a refill on your cardiac medications before your next appointment, please call your pharmacy*   Lab Work: Fasting lipid panel in 2 months, on 02/15/22, you can come anytime on this day between 7:15 AM and 4:30 PM If you have labs (blood work) drawn today and your tests are completely normal, you will receive your results only by: MyChart Message (if you have MyChart) OR A paper copy in the mail If you have any lab test that is abnormal or we need to change your treatment, we will call you to review the results.  Follow-Up: At [facility], you and your health needs are our priority.  As part of our continuing mission to provide you with exceptional heart care, we have created designated Provider Care Teams.  These Care Teams include your primary Cardiologist (physician) and Advanced Practice Providers (APPs -  Physician Assistants and Nurse Practitioners) who all work together to provide you with the care you need, when you need it.  Your next appointment:   02/19/2022 at 2:40 PM  The format for your next appointment:   In Person  Provider:   Verne Carrow, MD    Important Information About Sugar

## 2021-12-28 ENCOUNTER — Telehealth: Payer: Self-pay | Admitting: Cardiovascular Disease

## 2021-12-28 NOTE — Telephone Encounter (Signed)
Patient stated he was released from hospital on 9/9 and he is having left arm is soreness.

## 2021-12-28 NOTE — Telephone Encounter (Signed)
Called patient back about Dr. Gibson Ramp advisement. Informed patient that we would get clarification on "Some Swelling" to make sure that is okay.

## 2021-12-28 NOTE — Telephone Encounter (Signed)
Called patient about his left arm. Patient stated he had an IV in his left hand and left AC. Patient is now having pain/soreness at his wrist area between the two  IV sites. Patient stated it started feeling this way around 5:00 pm yesterday. Patient stated it is swollen and it is a mild pain like having a bruise. Patient denies any redness, bruising, or being warm to touch. Patient did say that he had the Left AC IV in first and requested to be moved due to the BP cuff hitting the IV and pinching every time it took his BP. Will send to Dr. Clifton James for advisement.

## 2021-12-29 ENCOUNTER — Encounter (HOSPITAL_COMMUNITY): Payer: Self-pay

## 2021-12-29 ENCOUNTER — Other Ambulatory Visit: Payer: Self-pay

## 2021-12-29 ENCOUNTER — Telehealth: Payer: Self-pay | Admitting: Cardiovascular Disease

## 2021-12-29 ENCOUNTER — Emergency Department (HOSPITAL_COMMUNITY): Payer: BC Managed Care – PPO

## 2021-12-29 ENCOUNTER — Observation Stay (HOSPITAL_COMMUNITY)
Admission: EM | Admit: 2021-12-29 | Discharge: 2021-12-30 | Disposition: A | Discharge status: Home / Self Care | Payer: BC Managed Care – PPO | Attending: Cardiovascular Disease | Admitting: Cardiovascular Disease

## 2021-12-29 DIAGNOSIS — R072 Precordial pain: Secondary | ICD-10-CM | POA: Diagnosis present

## 2021-12-29 DIAGNOSIS — I82409 Acute embolism and thrombosis of unspecified deep veins of unspecified lower extremity: Secondary | ICD-10-CM | POA: Diagnosis not present

## 2021-12-29 DIAGNOSIS — E039 Hypothyroidism, unspecified: Secondary | ICD-10-CM | POA: Insufficient documentation

## 2021-12-29 DIAGNOSIS — I2510 Atherosclerotic heart disease of native coronary artery without angina pectoris: Secondary | ICD-10-CM | POA: Insufficient documentation

## 2021-12-29 DIAGNOSIS — Z79899 Other long term (current) drug therapy: Secondary | ICD-10-CM | POA: Diagnosis not present

## 2021-12-29 DIAGNOSIS — Z955 Presence of coronary angioplasty implant and graft: Secondary | ICD-10-CM | POA: Insufficient documentation

## 2021-12-29 DIAGNOSIS — E119 Type 2 diabetes mellitus without complications: Secondary | ICD-10-CM | POA: Insufficient documentation

## 2021-12-29 DIAGNOSIS — R079 Chest pain, unspecified: Secondary | ICD-10-CM | POA: Diagnosis not present

## 2021-12-29 DIAGNOSIS — R0789 Other chest pain: Secondary | ICD-10-CM | POA: Diagnosis not present

## 2021-12-29 DIAGNOSIS — Z7984 Long term (current) use of oral hypoglycemic drugs: Secondary | ICD-10-CM | POA: Diagnosis not present

## 2021-12-29 DIAGNOSIS — Z7982 Long term (current) use of aspirin: Secondary | ICD-10-CM | POA: Diagnosis not present

## 2021-12-29 DIAGNOSIS — R001 Bradycardia, unspecified: Secondary | ICD-10-CM | POA: Diagnosis not present

## 2021-12-29 LAB — BASIC METABOLIC PANEL
Anion gap: 8 (ref 5–15)
BUN: 20 mg/dL (ref 6–20)
CO2: 24 mmol/L (ref 22–32)
Calcium: 9.1 mg/dL (ref 8.9–10.3)
Chloride: 105 mmol/L (ref 98–111)
Creatinine, Ser: 1.1 mg/dL (ref 0.61–1.24)
GFR, Estimated: >60 mL/min (ref >60)
Glucose, Bld: 106 mg/dL — ABNORMAL HIGH (ref 70–99)
Potassium: 4.3 mmol/L (ref 3.5–5.1)
Sodium: 137 mmol/L (ref 135–145)

## 2021-12-29 LAB — CBC WITH DIFFERENTIAL/PLATELET
Abs Immature Granulocytes: 0.02 10*3/uL (ref 0.00–0.07)
Basophils Absolute: 0 10*3/uL (ref 0.0–0.1)
Basophils Relative: 0 %
Eosinophils Absolute: 0 10*3/uL (ref 0.0–0.5)
Eosinophils Relative: 0 %
HCT: 51.3 % (ref 39.0–52.0)
Hemoglobin: 17.5 g/dL — ABNORMAL HIGH (ref 13.0–17.0)
Immature Granulocytes: 0 %
Lymphocytes Relative: 23 %
Lymphs Abs: 1.6 10*3/uL (ref 0.7–4.0)
MCH: 32.6 pg (ref 26.0–34.0)
MCHC: 34.1 g/dL (ref 30.0–36.0)
MCV: 95.5 fL (ref 80.0–100.0)
Monocytes Absolute: 0.5 10*3/uL (ref 0.1–1.0)
Monocytes Relative: 7 %
Neutro Abs: 4.8 10*3/uL (ref 1.7–7.7)
Neutrophils Relative %: 70 %
Platelets: 262 10*3/uL (ref 150–400)
RBC: 5.37 MIL/uL (ref 4.22–5.81)
RDW: 12.3 % (ref 11.5–15.5)
WBC: 6.8 10*3/uL (ref 4.0–10.5)
nRBC: 0 % (ref 0.0–0.2)

## 2021-12-29 LAB — CBC
HCT: 51 % (ref 39.0–52.0)
Hemoglobin: 17.7 g/dL — ABNORMAL HIGH (ref 13.0–17.0)
MCH: 32.5 pg (ref 26.0–34.0)
MCHC: 34.7 g/dL (ref 30.0–36.0)
MCV: 93.8 fL (ref 80.0–100.0)
Platelets: 260 10*3/uL (ref 150–400)
RBC: 5.44 MIL/uL (ref 4.22–5.81)
RDW: 12.3 % (ref 11.5–15.5)
WBC: 8.5 10*3/uL (ref 4.0–10.5)
nRBC: 0 % (ref 0.0–0.2)

## 2021-12-29 LAB — CREATININE, SERUM
Creatinine, Ser: 1.08 mg/dL (ref 0.61–1.24)
GFR, Estimated: >60 mL/min (ref >60)

## 2021-12-29 LAB — TROPONIN I (HIGH SENSITIVITY)
Troponin I (High Sensitivity): 160 ng/L (ref <18)
Troponin I (High Sensitivity): 156 ng/L (ref <18)

## 2021-12-29 LAB — HIV ANTIBODY (ROUTINE TESTING W REFLEX): HIV Screen 4th Generation wRfx: NONREACTIVE

## 2021-12-29 MED ORDER — METOPROLOL SUCCINATE ER 50 MG PO TB24
50.0000 mg | ORAL_TABLET | Freq: Every day | ORAL | Status: DC
  Filled 2021-12-29: qty 1

## 2021-12-29 MED ORDER — HYDROXYZINE HCL 10 MG PO TABS
10.0000 mg | ORAL_TABLET | Freq: Three times a day (TID) | ORAL | Status: DC | PRN
  Administered 2021-12-29: 10 mg via ORAL
  Filled 2021-12-29: qty 1

## 2021-12-29 MED ORDER — TICAGRELOR 90 MG PO TABS
90.0000 mg | ORAL_TABLET | Freq: Two times a day (BID) | ORAL | Status: DC
  Administered 2021-12-29 – 2021-12-30 (×2): 90 mg via ORAL
  Filled 2021-12-29 (×2): qty 1

## 2021-12-29 MED ORDER — HEPARIN SODIUM (PORCINE) 5000 UNIT/ML IJ SOLN
5000.0000 [IU] | Freq: Three times a day (TID) | INTRAMUSCULAR | Status: DC
  Administered 2021-12-29 – 2021-12-30 (×2): 5000 [IU] via SUBCUTANEOUS
  Filled 2021-12-29 (×2): qty 1

## 2021-12-29 MED ORDER — NITROGLYCERIN 0.4 MG SL SUBL: 0.4000 mg | SUBLINGUAL_TABLET | SUBLINGUAL | Status: DC | PRN

## 2021-12-29 MED ORDER — LEVOTHYROXINE SODIUM 112 MCG PO TABS
112.0000 ug | ORAL_TABLET | Freq: Every day | ORAL | Status: DC
  Administered 2021-12-30: 112 ug via ORAL
  Filled 2021-12-29 (×2): qty 1

## 2021-12-29 MED ORDER — METFORMIN HCL 500 MG PO TABS
500.0000 mg | ORAL_TABLET | Freq: Every day | ORAL | Status: DC
  Administered 2021-12-30: 500 mg via ORAL
  Filled 2021-12-29: qty 1

## 2021-12-29 MED ORDER — ASPIRIN 81 MG PO CHEW
81.0000 mg | CHEWABLE_TABLET | Freq: Every day | ORAL | Status: DC
  Administered 2021-12-30: 81 mg via ORAL
  Filled 2021-12-29: qty 1

## 2021-12-29 MED ORDER — ATORVASTATIN CALCIUM 80 MG PO TABS
80.0000 mg | ORAL_TABLET | Freq: Every day | ORAL | Status: DC
  Administered 2021-12-29: 80 mg via ORAL
  Filled 2021-12-29: qty 1

## 2021-12-29 NOTE — ED Notes (Signed)
Cardiology at bedside.

## 2021-12-29 NOTE — ED Triage Notes (Addendum)
Pt arrived from home with complaints of chest pain midsternally that radiates to the back that started last night around 2200. Pt describes it as a tightness. Pt had a heart attack last Thursday while at [facility], pt then had to be resuscitated and transferred to [facility] for [facility] lab. Two stents were placed. Pt is alert and oriented in triage, speaking in full sentences but does express that he is concerned since he was almost discharged from [facility] before he "died."  One sublingual nitro taken at 0900, with no relief.

## 2021-12-29 NOTE — Telephone Encounter (Signed)
Pt c/o of Chest Pain: STAT if CP now or developed within 24 hours  1. Are you having CP right now? Yes  2. Are you experiencing any other symptoms (ex. SOB, nausea, vomiting, sweating)? Some SOB  3. How long have you been experiencing CP? Started last night, also had back pain  4. Is your CP continuous or coming and going? yes  5. Have you taken Nitroglycerin? No   Patient states he has had chest pressure since last night and also had back pain in between his shoulder blades.  ?

## 2021-12-29 NOTE — ED Provider Triage Note (Signed)
Emergency Medicine Provider Triage Evaluation Note  Todd [name] , a 46 y.o. male  was evaluated in triage.  Pt complains of new onset chest pain that started around 10:00 last night.  Patient describes it as a tightness.  He went to [facility] last week with complaints of chest pain where he coded in the emergency department and was transferred to Cath Lab.  He had 2 stents placed for thrombotic occlusion of the coronary artery.  He is currently on Brilinta.  He states his current pain is a tightness, substernally and it is about 1/5 of the degree of pain that he felt during his initial event.  He denies shortness of breath, abdominal pain, nausea, vomiting, diarrhea.  He expresses concern about the testing today as he was almost discharged last week after extensive testing prior to his code.  Review of Systems  Positive:  Negative:   Physical Exam  BP (!) 130/90 (BP Location: Left Arm)   Pulse 63   Temp 98 F (36.7 C) (Oral)   Resp 18   Ht 6' (1.829 m)   Wt 111.1 kg   SpO2 99%   BMI 33.23 kg/m  Gen:   Awake, no distress, not paretic Resp:  Normal effort, calm speaking in full and complete sentences MSK:   Moves extremities without difficulty  Other:    Medical Decision Making  Medically screening exam initiated at 10:28 AM.  Appropriate orders placed.  Todd [name] was informed that the remainder of the evaluation will be completed by another provider, this initial triage assessment does not replace that evaluation, and the importance of remaining in the ED until their evaluation is complete.     Janell Quiet, [place] 12/29/21 1030

## 2021-12-29 NOTE — ED Provider Notes (Signed)
[facility] EMERGENCY DEPARTMENT Provider Note   CSN: [id number] Arrival date & time: 12/29/21  [id number]     History  Chief Complaint  Patient presents with   Chest Pain    Todd [name] is a 46 y.o. male with history of hyperlipidemia, hypothyroidism and recent postcardiac arrest STEMI that occurred 1 week ago presents to the emergency department for evaluation of new onset chest pain since last night.  Patient was at [facility] on 12/21/2021 with complaints of chest pain and had CTA with no evidence of dissection.  Initial troponin had been normal, and while waiting for the second troponin, patient went into cardiac arrest with torsades and V-fib.  Postarrest EKG with lateral ST elevation.  He was transferred to [facility] and had emergent cardiac catheterization with 100% stenosis of the proximal LCx and had 2 stents placed.  He was started on Brilinta and discharged home on 12/23/2021.  Patient has been taking Brilinta and beta-blocker daily.  Last night, he developed substernal chest pain described as tightness not nearly as severe as it was last week.  Pain does not radiate and he denies shortness of breath, nausea, vomiting and diarrhea.  He did take a nitro tablet and a 324 mg aspirin without relief in symptoms.  He called his cardiologist who advised him to come to the emergency department for evaluation.   Chest Pain Associated symptoms: no abdominal pain, no fever, no headache, no nausea, no shortness of breath and no vomiting        Home Medications Prior to Admission medications   Medication Sig Start Date End Date Taking? Authorizing Provider  acetaminophen (TYLENOL) 325 MG tablet Take 2 tablets (650 mg total) by mouth every 4 (four) hours as needed for headache or mild pain. 12/23/21  Yes Isaiah Serge, NP  aspirin 81 MG chewable tablet Chew 1 tablet (81 mg total) by mouth daily. 12/24/21  Yes Isaiah Serge, NP  atorvastatin (LIPITOR) 80 MG tablet  Take 1 tablet (80 mg total) by mouth daily. Patient taking differently: Take 80 mg by mouth at bedtime. 12/24/21  Yes Isaiah Serge, NP  B Complex Vitamins (B-COMPLEX/B-12 PO) Take 1 tablet by mouth daily.   Yes [provider]  Cholecalciferol (D3 5000 PO) Take 1 tablet by mouth daily.   Yes [provider]  L-METHIONINE PO Take 1 capsule by mouth daily.   Yes [provider]  Levomefolic Acid (5-MTHF PO) Take 1 tablet by mouth daily.   Yes [provider]  levothyroxine (SYNTHROID) 112 MCG tablet Take 112 mcg by mouth daily before breakfast.   Yes [provider]  MAGNESIUM PO Take 1 tablet by mouth daily.   Yes [provider]  Menaquinone-7 (K2 PO) Take 1 capsule by mouth daily.   Yes [provider]  metFORMIN (GLUCOPHAGE) 500 MG tablet Take 500 mg by mouth daily. 08/24/21  Yes [provider]  metoprolol succinate (TOPROL-XL) 50 MG 24 hr tablet Take 1 tablet (50 mg total) by mouth daily. Take with or immediately following a meal. 12/24/21  Yes Isaiah Serge, NP  Multiple Vitamin (MULTIVITAMIN) tablet Take 1 tablet by mouth daily.   Yes [provider]  Niacinamide-Zn-Cu-Methfo-Se-Cr (NICOTINAMIDE PO) Take 1 capsule by mouth daily.   Yes [provider]  nitroGLYCERIN (NITROSTAT) 0.4 MG SL tablet Place 1 tablet (0.4 mg total) under the tongue every 5 (five) minutes as needed for chest pain. 12/23/21  Yes Dorene Ar,  Darcella Gasman, NP  Omega-3 Fatty Acids (OMEGA-3 PLUS PO) Take 1 capsule by mouth daily.   Yes [provider]  RESVERATROL PO Take 1 capsule by mouth daily.   Yes [provider]  ticagrelor (BRILINTA) 90 MG TABS tablet Take 1 tablet (90 mg total) by mouth 2 (two) times daily. 12/23/21  Yes Leone Brand, NP  zinc gluconate 50 MG tablet Take 50 mg by mouth daily.   Yes [provider]      Allergies    Patient has no known allergies.    Review of Systems   Review of  Systems  Constitutional:  Negative for fever.  Respiratory:  Negative for shortness of breath.   Cardiovascular:  Positive for chest pain. Negative for leg swelling.  Gastrointestinal:  Negative for abdominal pain, diarrhea, nausea and vomiting.  Neurological:  Negative for headaches.    Physical Exam Updated Vital Signs BP (!) 143/100   Pulse 68   Temp 99.1 F (37.3 C)   Resp 13   Ht 6' (1.829 m)   Wt 111.1 kg   SpO2 96%   BMI 33.23 kg/m  Physical Exam Vitals and nursing note reviewed.  Constitutional:      General: He is not in acute distress.    Appearance: He is not ill-appearing or diaphoretic.  HENT:     Head: Atraumatic.  Eyes:     Conjunctiva/sclera: Conjunctivae normal.  Cardiovascular:     Rate and Rhythm: Normal rate and regular rhythm.     Pulses: Normal pulses.          Radial pulses are 2+ on the right side and 2+ on the left side.       Dorsalis pedis pulses are 2+ on the right side and 2+ on the left side.     Heart sounds: No murmur heard. Pulmonary:     Effort: Pulmonary effort is normal. No respiratory distress.     Breath sounds: Normal breath sounds.  Abdominal:     General: Abdomen is flat. There is no distension.     Palpations: Abdomen is soft.     Tenderness: There is no abdominal tenderness.  Musculoskeletal:        General: Normal range of motion.     Cervical back: Normal range of motion.  Skin:    General: Skin is warm and dry.     Capillary Refill: Capillary refill takes less than 2 seconds.  Neurological:     General: No focal deficit present.     Mental Status: He is alert.  Psychiatric:        Mood and Affect: Mood normal.     ED Results / Procedures / Treatments   Labs (all labs ordered are listed, but only abnormal results are displayed) Labs Reviewed  BASIC METABOLIC PANEL - Abnormal; Notable for the following components:      Result Value   Glucose, Bld 106 (*)    All other components within normal limits  CBC WITH  DIFFERENTIAL/PLATELET - Abnormal; Notable for the following components:   Hemoglobin 17.5 (*)    All other components within normal limits  TROPONIN I (HIGH SENSITIVITY) - Abnormal; Notable for the following components:   Troponin I (High Sensitivity) 160 (*)    All other components within normal limits  TROPONIN I (HIGH SENSITIVITY) - Abnormal; Notable for the following components:   Troponin I (High Sensitivity) 156 (*)    All other components within normal limits  HIV  ANTIBODY (ROUTINE TESTING W REFLEX)  CBC  CREATININE, SERUM    EKG None  Radiology DG Chest 2 View  Result Date: 12/29/2021 CLINICAL DATA:  chest pain EXAM: CHEST - 2 VIEW COMPARISON:  Radiograph 12/21/2021 FINDINGS: The cardiomediastinal silhouette is within normal limits. There is no focal airspace consolidation. There is no pleural effusion. No pneumothorax. There is no acute osseous abnormality. IMPRESSION: No evidence of acute cardiopulmonary disease. Electronically Signed   By: Caprice Renshaw M.D.   On: 12/29/2021 10:49    Procedures Procedures    Medications Ordered in ED Medications  heparin injection 5,000 Units (has no administration in time range)  aspirin chewable tablet 81 mg (has no administration in time range)  atorvastatin (LIPITOR) tablet 80 mg (has no administration in time range)  metoprolol succinate (TOPROL-XL) 24 hr tablet 50 mg (has no administration in time range)  nitroGLYCERIN (NITROSTAT) SL tablet 0.4 mg (has no administration in time range)  levothyroxine (SYNTHROID) tablet 112 mcg (has no administration in time range)  metFORMIN (GLUCOPHAGE) tablet 500 mg (has no administration in time range)  ticagrelor (BRILINTA) tablet 90 mg (has no administration in time range)  hydrOXYzine (ATARAX) tablet 10 mg (has no administration in time range)    ED Course/ Medical Decision Making/ A&P                           Medical Decision Making Amount and/or Complexity of Data Reviewed Labs:  ordered. Radiology: ordered.  Risk Decision regarding hospitalization.   Social determinants of health:  Social History   Socioeconomic History   Marital status: Married    Spouse name: Not on file   Number of children: Not on file   Years of education: Not on file   Highest education level: Not on file  Occupational History   Not on file  Tobacco Use   Smoking status: Never   Smokeless tobacco: Not on file  Substance and Sexual Activity   Alcohol use: No   Drug use: No   Sexual activity: Not on file  Other Topics Concern   Not on file  Social History Narrative   Not on file   Social Determinants of Health   Financial Resource Strain: Not on file  Food Insecurity: Not on file  Transportation Needs: Not on file  Physical Activity: Not on file  Stress: Not on file  Social Connections: Not on file  Intimate Partner Violence: Not on file     Initial impression:  This patient presents to the ED for concern of chest pain, this involves an extensive number of treatment options, and is a complaint that carries with it a high risk of complications and morbidity.   Differentials include ACS, PE, dissection, viral infection, anxiety, pneumonia.   Comorbidities affecting care:  Recent cardiac arrest with postarrest STEMI status post 2 stents  Additional history obtained: Cardiology records, labs and imaging  Lab Tests  I Ordered, reviewed, and interpreted labs and EKG.  The pertinent results include:  Initial troponin 160, second troponin 156 CBC and BMP unremarkable  Imaging Studies ordered:  I ordered imaging studies including  Chest ray without acute findings I independently visualized and interpreted imaging and I agree with the radiologist interpretation.   EKG: Normal sinus rhythm   Consultations Obtained:  I requested consultation with cardiology and spoke with Trish who agrees to have member of cardiology team evaluate patient,  and discussed lab and  imaging findings  as well as pertinent plan - they recommend: Admit for overnight observation with serial troponins   ED Course/Re-evaluation: Patient presents in no acute distress and is nontoxic-appearing.  He is nondiaphoretic, speaking in full and complete sentences and satting at 98% on room air.  Vitals without significant abnormality.  Physical exam overall benign although patient does seem slightly anxious given his cardiac event that occurred last week.  No JVD, lower extremity swelling.  Lungs CTA bilaterally.  Heart sounds normal.  Distal pulses intact.  Initial troponin ordered in triage of 160 with repeat troponin of 156.  I contacted cardiology who came and evaluated the patient. Dr. Angelena Form recommends overnight observation with serial troponins.  Suspects that troponin is still elevated from his cardiac event last week and that there is some degree of anxiety aggravating patient's symptoms.  Disposition:  After consideration of the diagnostic results, physical exam, history and the patients response to treatment feel that the patent would benefit from admission.   Chest pain: Plan and management as described above. Discharged home in good condition.  Final Clinical Impression(s) / ED Diagnoses Final diagnoses:  Chest pain, unspecified type    Rx / DC Orders ED Discharge Orders     None         Rodena Piety 12/29/21 1629    Milton Ferguson, MD 12/31/21 [phone]

## 2021-12-29 NOTE — Telephone Encounter (Signed)
Spoke with the patient who reports that since last night he has been having pain between his shoulder blades and tightness in his chest. Patient had a STEMI on 9/7 and had two stents placed.  He reports that at that time he also had pain in his back and chest tightness, however it was unbearable. He states that currently it is much lighter and not as severe. He denies any shortness of breath, nausea, or diaphoresis. He states BP and HR have been normal. He has not taken any nitroglycerin which I have advised him to do.  Spoke with DOD, Dr. Excell Seltzer who agrees that patient should go to the ER for further evaluation with current chest tightness and similar symptoms to last week. Advised patient who is agreeable with this plan.

## 2021-12-29 NOTE — ED Notes (Signed)
ED TO INPATIENT HANDOFF REPORT  ED Nurse Name and Phone #: Khaalid Lefkowitz RN [phone]  S Name/Age/Gender Todd [name] 46 y.o. male Room/Bed: 010C/010C  Code Status   Code Status: Full Code  Home/SNF/Other Home Patient oriented to: self, place, time, and situation Is this baseline? Yes   Triage Complete: Triage complete  Chief Complaint Chest pain [R07.9]  Triage Note Pt arrived from home with complaints of chest pain midsternally that radiates to the back that started last night around 2200. Pt describes it as a tightness. Pt had a heart attack last Thursday while at [facility], pt then had to be resuscitated and [facility]. Two stents were placed. Pt is alert and oriented in triage, speaking in full sentences but does express that he is concerned since he was almost discharged from [facility] before he "died."  One sublingual nitro taken at 0900, with no relief.   Allergies No Known Allergies  Level of Care/Admitting Diagnosis ED Disposition     ED Disposition  Admit   Condition  --   Comment  Hospital Area: [facility] [[id number]]  Level of Care: Telemetry Cardiac [[id number]]  May place patient in observation at [facility] or [facility] if equivalent level of care is available:: No  Covid Evaluation: Asymptomatic - no recent exposure (last 10 days) testing not required  Diagnosis: Chest pain [id number]  Admitting Physician: [place], [place]  Attending Physician: Lauree Chandler D [[id number]]          B Medical/Surgery History Past Medical History:  Diagnosis Date   Hyperlipidemia    NSVT (nonsustained ventricular tachycardia) ([place]) 12/23/2021   S/P angioplasty with stent s/p LCX PCI and OM2 POBA 12/21/21  12/23/2021   Thyroid disease    Past Surgical History:  Procedure Laterality Date   CORONARY/GRAFT ACUTE MI REVASCULARIZATION N/A 12/21/2021   Procedure: Coronary/Graft Acute MI Revascularization;  Surgeon: Burnell Blanks, MD;  Location: [facility];  Service: Cardiovascular;  Laterality: N/A;   EYE SURGERY     LEFT HEART CATH AND CORONARY ANGIOGRAPHY N/A 12/21/2021   Procedure: LEFT HEART CATH AND CORONARY ANGIOGRAPHY;  Surgeon: Burnell Blanks, MD;  Location: [facility];  Service: Cardiovascular;  Laterality: N/A;     A IV Location/Drains/Wounds Patient Lines/Drains/Airways Status     Active Line/Drains/Airways     Name Placement date Placement time Site Days   Peripheral IV 12/21/21 20 G Anterior;Distal;Left;Upper Arm 12/21/21  0930  Arm  8   Peripheral IV 12/21/21 20 G 1" Left;Posterior Hand 12/21/21  1221  Hand  8   Peripheral IV 12/29/21 20 G Right Antecubital 12/29/21  1659  Antecubital  less than 1   Wound / Incision (Open or Dehisced) 12/23/21 (MARSI) Medical Adhesive-Related Skin Injury Back Left skin tear around site where zoll pads were attaached 12/23/21  0800  Back  6            Intake/Output Last 24 hours No intake or output data in the 24 hours ending 12/29/21 1718  Labs/Imaging Results for orders placed or performed during the hospital encounter of 12/29/21 (from the past 48 hour(s))  Basic metabolic panel     Status: Abnormal   Collection Time: 12/29/21 10:25 AM  Result Value Ref Range   Sodium 137 135 - 145 mmol/L   Potassium 4.3 3.5 - 5.1 mmol/L   Chloride 105 98 - 111 mmol/L   CO2 24 22 - 32 mmol/L  Glucose, Bld 106 (H) 70 - 99 mg/dL    Comment: Glucose reference range applies only to samples taken after fasting for at least 8 hours.   BUN 20 6 - 20 mg/dL   Creatinine, Ser 7.82 0.61 - 1.24 mg/dL   Calcium 9.1 8.9 - 95.6 mg/dL   GFR, Estimated >21 >30 mL/min    Comment: (NOTE) Calculated using the CKD-EPI Creatinine Equation (2021)    Anion gap 8 5 - 15    Comment: Performed at [facility], [address], [place], [place] [address]  CBC with Differential     Status: Abnormal   Collection Time: 12/29/21 10:25 AM  Result  Value Ref Range   WBC 6.8 4.0 - 10.5 K/uL   RBC 5.37 4.22 - 5.81 MIL/uL   Hemoglobin 17.5 (H) 13.0 - 17.0 g/dL   HCT 46.9 62.9 - 52.8 %   MCV 95.5 80.0 - 100.0 fL   MCH 32.6 26.0 - 34.0 pg   MCHC 34.1 30.0 - 36.0 g/dL   RDW 41.3 24.4 - 01.0 %   Platelets 262 150 - 400 K/uL   nRBC 0.0 0.0 - 0.2 %   Neutrophils Relative % 70 %   Neutro Abs 4.8 1.7 - 7.7 K/uL   Lymphocytes Relative 23 %   Lymphs Abs 1.6 0.7 - 4.0 K/uL   Monocytes Relative 7 %   Monocytes Absolute 0.5 0.1 - 1.0 K/uL   Eosinophils Relative 0 %   Eosinophils Absolute 0.0 0.0 - 0.5 K/uL   Basophils Relative 0 %   Basophils Absolute 0.0 0.0 - 0.1 K/uL   Immature Granulocytes 0 %   Abs Immature Granulocytes 0.02 0.00 - 0.07 K/uL    Comment: Performed at [facility], [address] [place], [place] [address]  Troponin I (High Sensitivity)     Status: Abnormal   Collection Time: 12/29/21 10:25 AM  Result Value Ref Range   Troponin I (High Sensitivity) 160 (HH) <18 ng/L    Comment: CRITICAL RESULT CALLED TO, READ BACK BY AND VERIFIED WITH CLARK RWE RN 12/29/21 1156 M KOROLESKI (NOTE) Elevated high sensitivity troponin I (hsTnI) values and significant  changes across serial measurements may suggest ACS but many other  chronic and acute conditions are known to elevate hsTnI results.  Refer to the "Links" section for chest pain algorithms and additional  guidance. Performed at [facility], [address], [place], [place] [address]   Troponin I (High Sensitivity)     Status: Abnormal   Collection Time: 12/29/21  1:30 PM  Result Value Ref Range   Troponin I (High Sensitivity) 156 (HH) <18 ng/L    Comment: CRITICAL VALUE NOTED. VALUE IS CONSISTENT WITH PREVIOUSLY REPORTED/CALLED VALUE (NOTE) Elevated high sensitivity troponin I (hsTnI) values and significant  changes across serial measurements may suggest ACS but many other  chronic and acute conditions are known to elevate hsTnI results.  Refer to the  "Links" section for chest pain algorithms and additional  guidance. Performed at [facility], [address], [place], [place] [address]    DG Chest 2 View  Result Date: 12/29/2021 CLINICAL DATA:  chest pain EXAM: CHEST - 2 VIEW COMPARISON:  Radiograph 12/21/2021 FINDINGS: The cardiomediastinal silhouette is within normal limits. There is no focal airspace consolidation. There is no pleural effusion. No pneumothorax. There is no acute osseous abnormality. IMPRESSION: No evidence of acute cardiopulmonary disease. Electronically Signed   By: Erma Heritage.D.  On: 12/29/2021 10:49    Pending Labs Unresulted Labs (From admission, onward)     Start     Ordered   12/30/21 0500  Basic metabolic panel  Tomorrow morning,   R        12/29/21 1530   12/30/21 0500  CBC  Tomorrow morning,   R        12/29/21 1530   12/29/21 1530  HIV Antibody (routine testing w rflx)  (HIV Antibody (Routine testing w reflex) panel)  Once,   R        12/29/21 1530   12/29/21 1530  CBC  (heparin)  Once,   R       Comments: Baseline for heparin therapy IF NOT ALREADY DRAWN.  Notify MD if PLT < 100 K.    12/29/21 1530   12/29/21 1530  Creatinine, serum  (heparin)  Once,   R       Comments: Baseline for heparin therapy IF NOT ALREADY DRAWN.    12/29/21 1530            Vitals/Pain Today's Vitals   12/29/21 1500 12/29/21 1630 12/29/21 1645 12/29/21 1700  BP: (!) 143/100 (!) 131/90  (!) 138/96  Pulse: 68 (!) 58 63 (!) 59  Resp: 13 (!) 24 19 14   Temp:      TempSrc:      SpO2: 96% 95% 96% 97%  Weight:      Height:      PainSc:        Isolation Precautions No active isolations  Medications Medications  heparin injection 5,000 Units (has no administration in time range)  aspirin chewable tablet 81 mg (has no administration in time range)  atorvastatin (LIPITOR) tablet 80 mg (has no administration in time range)  metoprolol succinate (TOPROL-XL) 24 hr tablet 50 mg (has no administration in time  range)  nitroGLYCERIN (NITROSTAT) SL tablet 0.4 mg (has no administration in time range)  levothyroxine (SYNTHROID) tablet 112 mcg (has no administration in time range)  metFORMIN (GLUCOPHAGE) tablet 500 mg (has no administration in time range)  ticagrelor (BRILINTA) tablet 90 mg (has no administration in time range)  hydrOXYzine (ATARAX) tablet 10 mg (has no administration in time range)    Mobility walks Low fall risk   Focused Assessments Cardiac Assessment Handoff:    Lab Results  Component Value Date   TROPONINI <0.03 08/15/2018   No results found for: "DDIMER" Does the Patient currently have chest pain? No    R Recommendations: See Admitting Provider Note  Report given to:   Additional Notes: Patient is ambulatory and alert and oriented x4.

## 2021-12-29 NOTE — H&P (Addendum)
Cardiology history and physical   Patient ID: Todd [name] MRN: [id number]; DOB: [birth year]  Admit date: 12/29/2021 Date of Consult: 12/29/2021  PCP:  Vivien Presto, MD   [facility] Providers Cardiologist:  Verne Carrow, MD   {   Patient Profile:   Todd [name] is a 46 y.o. male with a hx of CAD with lateral STEMI 12/21/21, type 2 diabetes, hypothyroidism, who is being seen 12/29/2021 for the evaluation of chest pain at the request of Raynald Blend PA-C.  History of Present Illness:   Todd [name] had recent hospitalization here from 12/21/21 to 12/23/21 for STEMI.  He presented to the [facility] ER 12/21/21 with chest pain, had Hs troponin 9>27 with normal EKG and negative CTA aortic dissection study.  He suffered cardiac arrest with torsades and ventricular fibrillation, 10 minutes following IV Toradol administration at ED.  He achieved ROSC within minutes of CPR/1 defibrillation/1 epinephrine.  Postarrest EKG revealed lateral ST elevation.  Code STEMI was called and he was transferred to [facility] for emergent cardiac catheterization, found to have thrombotic occlusion of mid circumflex, was treated with successful PTCA/DES x 2 proximal/mid circumflex and balloon angioplasty to ostium of OM2. There was Mild plaque in the LAD and RCA. Echo from 12/22/21 showed LVEF 50-55%, grade I DD, normal RV, trivial MR, aortic sclerosis.  He was recommended to stop OTC testosterone supplements as well as weight loss supplements, start aspirin and Brilinta for 1 year, Lipitor 80 mg, and metoprolol XL 50 mg for medical therapy at the time of discharge.   He followed up with Ms. Geni Bers APP in the office 12/27/21, he reported 5-second lightheadedness during the past week while watching his kids at the gym.  He was scared.  He has been walking 13 minutes twice daily.  He wanted to go back to weightlifting.  He has been calling the office multiple times 12/28/2021 reporting left arm soreness,  12/29/2021 reporting chest pressure and shoulder blade/back.  He was advised going to the ER.  Patient states he had 5 second lightheadedness last week while talking to someone.  He was nervous as this reminded him about his heart attack, where he also experienced lightheadedness.  He called office yesterday reporting left arm shortness.  He had IV placed to antecubital and radial site.  He states left arm is slightly sore with mild swelling, there is no significant redness, warmth, sharp pain.  Last night he was getting ready to go to bed, he started having midsternal chest pain and bilateral shoulder blade pain.  Pain was similar to his heart attack, but much mild, 2/10.  Pain lasted all night and all day today.  He feels mildly short of breath as well.  He is extremely worried, states he has been crying in the ER over the past few hours after he heard his troponin was abnormal.  He admits that he is having a lots of stress and anxiety since his cardiac arrest event, he is worried about his wife and children as he is the main caregiver, he is worried if he would have another cardiac arrest. He has been taking all of his medication religiously.  He denied any tobacco use.  His wife is at bedside, brought records of his blood pressure at home, SBP range 106-120s on average.  He has been tolerating activity, walking up to 15 minutes daily without experiencing any angina.  He has stopped taking numerous over-the-counter hormonal supplements.  Admission diagnostic so far revealed  unremarkable grossly unremarkable CBC differential and BMP.  High sensitive troponin 160.  Chest x-ray showed no evidence of acute finding. EKG showed sinus rhythm with ventricular rate of 66 bpm, T wave inversion of aVL, poor R wave progression, overall unchanged from previous post cath EKG. He is afebrile, mildly hypertensive with blood pressure up to 151/102, otherwise hemodynamically stable at ED. cardiology is consulted for further  evaluation of chest pain.        Past Medical History:  Diagnosis Date   Hyperlipidemia    NSVT (nonsustained ventricular tachycardia) ([place]) 12/23/2021   S/P angioplasty with stent s/p LCX PCI and OM2 POBA 12/21/21  12/23/2021   Thyroid disease     Past Surgical History:  Procedure Laterality Date   CORONARY/GRAFT ACUTE MI REVASCULARIZATION N/A 12/21/2021   Procedure: Coronary/Graft Acute MI Revascularization;  Surgeon: Burnell Blanks, MD;  Location: [facility];  Service: Cardiovascular;  Laterality: N/A;   EYE SURGERY     LEFT HEART CATH AND CORONARY ANGIOGRAPHY N/A 12/21/2021   Procedure: LEFT HEART CATH AND CORONARY ANGIOGRAPHY;  Surgeon: Burnell Blanks, MD;  Location: [facility];  Service: Cardiovascular;  Laterality: N/A;     Home Medications:  Prior to Admission medications   Medication Sig Start Date End Date Taking? Authorizing Provider  acetaminophen (TYLENOL) 325 MG tablet Take 2 tablets (650 mg total) by mouth every 4 (four) hours as needed for headache or mild pain. 12/23/21   Isaiah Serge, NP  aspirin 81 MG chewable tablet Chew 1 tablet (81 mg total) by mouth daily. 12/24/21   Isaiah Serge, NP  atorvastatin (LIPITOR) 80 MG tablet Take 1 tablet (80 mg total) by mouth daily. 12/24/21   Isaiah Serge, NP  B Complex Vitamins (B-COMPLEX/B-12 PO) Take 1 tablet by mouth daily.    [provider]  Cholecalciferol (D3 5000 PO) Take 1 tablet by mouth daily.    [provider]  DHEA 25 MG CAPS Take by mouth.    [provider]  Diindolylmethane POWD 1 Application by Does not apply route daily. 125mg     [provider]  Glutathione 6 GM/30ML SOLN Inject 1 mL as directed once a week.    [provider]  IPAMORELIN ACETATE IJ Inject 0.25 mg as directed.    [provider]  L-METHIONINE PO Take by mouth.    [provider]  Levomefolic Acid (5-MTHF PO) Take 1 tablet by mouth daily.     [provider]  levothyroxine (SYNTHROID) 112 MCG tablet Take 112 mcg by mouth daily before breakfast.    [provider]  MAGNESIUM PO Take 1 tablet by mouth daily.    [provider]  Menaquinone-7 (K2 PO) Take by mouth.    [provider]  metFORMIN (GLUCOPHAGE) 500 MG tablet Take 500 mg by mouth daily. 08/24/21   [provider]  metoprolol succinate (TOPROL-XL) 50 MG 24 hr tablet Take 1 tablet (50 mg total) by mouth daily. Take with or immediately following a meal. 12/24/21   Isaiah Serge, NP  Multiple Vitamin (MULTIVITAMIN) tablet Take 1 tablet by mouth daily.    [provider]  Niacinamide-Zn-Cu-Methfo-Se-Cr (NICOTINAMIDE PO) Take by mouth.    [provider]  nitroGLYCERIN (NITROSTAT) 0.4 MG SL tablet Place 1 tablet (0.4 mg total) under the tongue every 5 (five) minutes as needed for chest pain. 12/23/21   Isaiah Serge, NP  Omega-3 Fatty Acids (OMEGA-3 PLUS PO)  Take 1 capsule by mouth daily.    [provider]  PRESCRIPTION MEDICATION 25 mg as directed. ENCLOMIPHENE // 4 days per week    [provider]  RESVERATROL PO Take by mouth.    [provider]  ticagrelor (BRILINTA) 90 MG TABS tablet Take 1 tablet (90 mg total) by mouth 2 (two) times daily. 12/23/21   Isaiah Serge, NP  tirzepatide [facility]) 2.5 MG/0.5ML Pen Inject 2.5 mg into the skin once a week.    [provider]  zinc gluconate 50 MG tablet Take 50 mg by mouth daily.    [provider]    Inpatient Medications: Scheduled Meds:  Continuous Infusions:  PRN Meds:   Allergies:    Allergies  Allergen Reactions   Tretinoin Rash    redness    Social History:   Social History   Socioeconomic History   Marital status: Married    Spouse name: Not on file   Number of children: Not on file   Years of education: Not on file   Highest education level: Not on file  Occupational History   Not on file   Tobacco Use   Smoking status: Never   Smokeless tobacco: Not on file  Substance and Sexual Activity   Alcohol use: No   Drug use: No   Sexual activity: Not on file  Other Topics Concern   Not on file  Social History Narrative   Not on file   Social Determinants of Health   Financial Resource Strain: Not on file  Food Insecurity: Not on file  Transportation Needs: Not on file  Physical Activity: Not on file  Stress: Not on file  Social Connections: Not on file  Intimate Partner Violence: Not on file    Family History:    Family History  Problem Relation Age of Onset   Heart attack Mother 75     ROS:  Constitutional: Denied fever, chills, malaise, night sweats Eyes: Denied vision change or loss Ears/Nose/Mouth/Throat: Denied ear ache, sore throat, coughing, sinus pain Cardiovascular: See HPI Respiratory: See HPI Gastrointestinal: Denied nausea, vomiting, abdominal pain, diarrhea Genital/Urinary: Denied dysuria, hematuria, urinary frequency/urgency Musculoskeletal: Denied muscle ache, joint pain, weakness Skin: Denied rash, wound Neuro: Denied headache, dizziness, syncope Psych: Anxiety Endocrine: Denied history of diabetes   Physical Exam/Data:   Vitals:   12/29/21 0940 12/29/21 0959 12/29/21 1123 12/29/21 1330  BP: (!) 130/90  (!) 129/90 (!) 151/96  Pulse: 63  64 72  Resp: 18  16 13   Temp: 98 F (36.7 C)  98.3 F (36.8 C)   TempSrc: Oral  Oral   SpO2: 99%  98% 99%  Weight:  111.1 kg    Height:  6' (1.829 m)     No intake or output data in the 24 hours ending 12/29/21 1418    12/29/2021    9:59 AM 12/27/2021    8:47 AM 12/21/2021    9:27 AM  Last 3 Weights  Weight (lbs) 245 lb 250 lb 245 lb  Weight (kg) 111.131 kg 113.399 kg 111.131 kg     Body mass index is 33.23 kg/m.   Vitals:  Vitals:   12/29/21 1415 12/29/21 1425  BP: (!) 151/102   Pulse: 74   Resp: 15   Temp:  99.1 F (37.3 C)  SpO2: 97%    General Appearance: In no apparent  distress, laying in bed HEENT: Normocephalic, atraumatic. Neck: Supple, trachea midline, no JVD Cardiovascular: Regular rate and rhythm,  normal S1-S2,  no murmur Respiratory: Resting breathing unlabored, lungs sounds clear to auscultation bilaterally, no use of accessory muscles. On room air.  No wheezes, rales or rhonchi.   Gastrointestinal: Bowel sounds positive, abdomen soft Extremities: Able to move all extremities in bed without difficulty, no edema of BLE, left arm without significant erythema/edema/wound, mild tenderness on palpation, no subcutaneous air palpated, radial pulse 2+ bilaterally Musculoskeletal: Normal muscle bulk and tone, muscle strength 5/5 throughout, no limited range of motion, no swollen or erythematous joints Skin: Intact, warm, dry. No rashes or petechiae noted in exposed areas.  Neurologic: Alert, oriented to person, place and time. Fluent speech, no facial droop, no cognitive deficit, no pronator drift, no gross sensory deficit, no focal muscle weakness, no gross focal neuro deficit Psychiatric: Anxious    EKG:  The EKG was personally reviewed and demonstrates:    EKG showed sinus rhythm with ventricular rate of 66 bpm, T wave inversion of aVL, poor R wave progression, overall unchanged from previous post cath EKG.  Telemetry:  Telemetry was personally reviewed and demonstrates:    Sinus rhythm with ventricular rate of 60s  Relevant CV Studies:  Left heart catheterization from 12/21/2021:    Prox RCA lesion is 20% stenosed.   Prox Cx lesion is 100% stenosed.   Mid LAD lesion is 20% stenosed.   Ost Cx to Prox Cx lesion is 80% stenosed.   A drug-eluting stent was successfully placed using a SYNERGY XD 3.0X24.   A drug-eluting stent was successfully placed using a SYNERGY XD 3.50X28.   Post intervention, there is a 0% residual stenosis.   Post intervention, there is a 0% residual stenosis.   Acute lateral STEMI secondary to thrombotic occlusion of the mid  Circumflex Successful PTCA/DES x 2 proximal/mid circumflex.  Successful balloon angioplasty only ostium of OM2 Mild plaque in the LAD and RCA LVEDP 24 mmHg   Recommendations: Will admit to [place]. Continue DAPT with ASA and Brilinta for one year. High intensity statin. Wean Levophed as tolerated. Echo tomorrow.   Echocardiogram from 12/22/2021:   1. Hypokinesis of the inferolateral wall with overall low normal LV  function.   2. Left ventricular ejection fraction, by estimation, is 50 to 55%. The  left ventricle has low normal function. The left ventricle demonstrates  regional wall motion abnormalities (see scoring diagram/findings for  description). Left ventricular diastolic   parameters are consistent with Grade I diastolic dysfunction (impaired  relaxation).   3. Right ventricular systolic function is normal. The right ventricular  size is normal.   4. The mitral valve is normal in structure. Trivial mitral valve  regurgitation. No evidence of mitral stenosis.   5. The aortic valve is tricuspid. Aortic valve regurgitation is not  visualized. Aortic valve sclerosis is present, with no evidence of aortic  valve stenosis.   6. The inferior vena cava is normal in size with greater than 50%  respiratory variability, suggesting right atrial pressure of 3 mmHg.   Comparison(s): No prior Echocardiogram.  Laboratory Data:  High Sensitivity Troponin:   Recent Labs  Lab 12/21/21 0932 12/21/21 1120 12/29/21 1025  TROPONINIHS 9 27* 160*     Chemistry Recent Labs  Lab 12/23/21 0426 12/29/21 1025  NA 136 137  K 3.9 4.3  CL 107 105  CO2 21* 24  GLUCOSE 98 106*  BUN 12 20  CREATININE 1.06 1.10  CALCIUM 8.7* 9.1  GFRNONAA >60 >60  ANIONGAP 8 8    No results for  input(s): "PROT", "ALBUMIN", "AST", "ALT", "ALKPHOS", "BILITOT" in the last 168 hours. Lipids No results for input(s): "CHOL", "TRIG", "HDL", "LABVLDL", "LDLCALC", "CHOLHDL" in the last 168 hours.  Hematology Recent  Labs  Lab 12/29/21 1025  WBC 6.8  RBC 5.37  HGB 17.5*  HCT 51.3  MCV 95.5  MCH 32.6  MCHC 34.1  RDW 12.3  PLT 262   Thyroid No results for input(s): "TSH", "FREET4" in the last 168 hours.  BNPNo results for input(s): "BNP", "PROBNP" in the last 168 hours.  DDimer No results for input(s): "DDIMER" in the last 168 hours.   Radiology/Studies:  DG Chest 2 View  Result Date: 12/29/2021 CLINICAL DATA:  chest pain EXAM: CHEST - 2 VIEW COMPARISON:  Radiograph 12/21/2021 FINDINGS: The cardiomediastinal silhouette is within normal limits. There is no focal airspace consolidation. There is no pleural effusion. No pneumothorax. There is no acute osseous abnormality. IMPRESSION: No evidence of acute cardiopulmonary disease. Electronically Signed   By: Maurine Simmering M.D.   On: 12/29/2021 10:49     Assessment and Plan:   Chest pain CAD with lateral STEMI 12/21/2021 s/p DES to proximal/mid circumflex Anxiety -Presented with midsternal and bilateral shoulder blade pain 2/10, similar to previous MI, with associated shortness of breath, in the setting of increased anxiety and stress following recent hospitalization for cardiac arrest and MI - Hs trop 160 >156, likely downtrending from recent STEMI - EKG revealed no significant ischemic change - Chest pain is not pleuritic in nature, there is no significant murmur suggesting acute MR, there is no significant arrhythmia on telemetry, he appears euvolemic, at this time low suspicion for post MI acute complications -Discussed with patient that likely he is experiencing significant anxiety, possible a component of PTSD with recent cardiac arrest/MI, causing his current presentation.  He agrees that he is having lots of anxiety and stress is the main caregiver for the household.  Support has been provided at bedside.  Encouraged the patient to communicate his feelings with family and friends, allow time for healing, medical therapy for anxiety if needed, start  PRN hydroxyzine - will hold off further cardiac work-up at this time, admit to telemetry, monitor overnight, likely discharge in the morning -Continue medical therapy with DAPT including aspirin 81 and Brilinta 90 mg twice daily, metoprolol 50 mg daily, and Lipitor 80 mg daily  Type 2 diabetes -Resume home medication metformin -[facility] diet  Hypothyroidism -Resume home dose levothyroxine  DVT prophylaxis  - SQ heparin     Severity of Illness:  The appropriate patient status for this patient is OBSERVATION. Observation status is judged to be reasonable and necessary in order to provide the required intensity of service to ensure the patient's safety. The patient's presenting symptoms, physical exam findings, and initial radiographic and laboratory data in the context of their medical condition is felt to place them at decreased risk for further clinical deterioration. Furthermore, it is anticipated that the patient will be medically stable for discharge from the hospital within 2 midnights of admission.    Risk Assessment/Risk Scores:   For questions or updates, please contact [place] Please consult [email or web address] for contact info under    Signed, Margie Billet, NP  12/29/2021 2:18 PM  I have personally seen and examined this patient. I agree with the assessment and plan as outlined above.  I know him well from last week. He presented post cardiac arrest in the ED and an occluded large OM branch. His PCI went well. LV  function is normal.  He has been very anxious since being discharged. He has had some pain in his left arm at the site of an IV.  He began having mild chest pain and back pain last night.  He came into the ED this am for evaluation.  He has been taking all medications as advised including ASA and Brilinta EKG shows sinus, no ischemic changes Troponin is 160 but likely still on the downward trend from elevation last week. Repeat troponin 155. (Of note, we did not  follow serial troponins post MI last week)  My exam:  General: Well developed, well nourished, NAD  HEENT: OP clear, mucus membranes moist  SKIN: warm, dry. No rashes. Neuro: No focal deficits  Musculoskeletal: Muscle strength 5/5 all ext  Psychiatric: Mood and affect normal  Neck: No JVD, no carotid bruits, no thyromegaly, no lymphadenopathy.  Lungs:Clear bilaterally, no wheezes, rhonci, crackles Cardiovascular: Regular rate and rhythm. No murmurs, gallops or rubs. Abdomen:Soft. Bowel sounds present. Non-tender.  Extremities: No lower extremity edema. Pulses are 2 + in the bilateral DP/PT.  Plan: Chest pain: No objective evidence of ischemia. He is fearful that his pain is sign that he is about to have another MI. I have reassured him that there is no reason to believe that he will have an MI today. Time spent counseling the expected course over the next month. Will monitor overnight on observation status. Likely d/c home in am.   Verne Carrow, MD, [facility] 12/29/2021 4:07 PM

## 2021-12-29 NOTE — ED Notes (Signed)
Provider made aware of patients BP

## 2021-12-30 DIAGNOSIS — R0789 Other chest pain: Secondary | ICD-10-CM | POA: Diagnosis not present

## 2021-12-30 LAB — BASIC METABOLIC PANEL
Anion gap: 9 (ref 5–15)
BUN: 18 mg/dL (ref 6–20)
CO2: 26 mmol/L (ref 22–32)
Calcium: 9 mg/dL (ref 8.9–10.3)
Chloride: 106 mmol/L (ref 98–111)
Creatinine, Ser: 1.28 mg/dL — ABNORMAL HIGH (ref 0.61–1.24)
GFR, Estimated: >60 mL/min (ref >60)
Glucose, Bld: 89 mg/dL (ref 70–99)
Potassium: 4 mmol/L (ref 3.5–5.1)
Sodium: 141 mmol/L (ref 135–145)

## 2021-12-30 LAB — CBC
HCT: 49.3 % (ref 39.0–52.0)
Hemoglobin: 16.9 g/dL (ref 13.0–17.0)
MCH: 32.3 pg (ref 26.0–34.0)
MCHC: 34.3 g/dL (ref 30.0–36.0)
MCV: 94.1 fL (ref 80.0–100.0)
Platelets: 248 10*3/uL (ref 150–400)
RBC: 5.24 MIL/uL (ref 4.22–5.81)
RDW: 12.4 % (ref 11.5–15.5)
WBC: 7.5 10*3/uL (ref 4.0–10.5)
nRBC: 0 % (ref 0.0–0.2)

## 2021-12-30 MED ORDER — METOPROLOL SUCCINATE ER 25 MG PO TB24
25.0000 mg | ORAL_TABLET | Freq: Every day | ORAL | Status: DC
  Administered 2021-12-30: 25 mg via ORAL

## 2021-12-30 MED ORDER — METOPROLOL SUCCINATE ER 25 MG PO TB24: 25.0000 mg | ORAL_TABLET | Freq: Every day | ORAL | 2 refills | Status: DC

## 2021-12-30 NOTE — Progress Notes (Signed)
Patient discharged: Home with family  Discharge paperwork given: to patient and family  Reviewed with teach back  IV and telemetry disconnected  Belongings given to patient    

## 2021-12-30 NOTE — Progress Notes (Signed)
Pt understands d/c instructions and has no additional questions at this time. IV has been removed. Tele removed, ccmd aware. Pt has all belongings. Pt d/c wheelchair and request to walk himself out for discharge.

## 2021-12-30 NOTE — Discharge Summary (Addendum)
Discharge Summary    Patient ID: Todd [name] MRN: [id number]; DOB: 02-20-1976  Admit date: 12/29/2021 Discharge date: 12/30/2021  PCP:  Todd Rim, MD   [facility] Cardiologist:  Todd Chandler, MD   {   Discharge Diagnoses    Principal Problem:   Chest pain    Diagnostic Studies/Procedures    N/A this admission  _____________   History of Present Illness     Todd [name] is a 46 y.o. male with a hx of CAD with lateral STEMI 12/21/21, type 2 diabetes, hypothyroidism, cardiology was consulted on 12/29/2021 for the evaluation of chest pain.   Todd [name] had recent hospitalization at [facility] from 12/21/21 to 12/23/21 for STEMI.  He presented to the [facility] ER 12/21/21 with chest pain, had Hs troponin 9>27 with normal EKG and negative CTA aortic dissection study.  He suffered cardiac arrest with torsades and ventricular fibrillation, 10 minutes following IV Toradol administration at ED.  He achieved ROSC within minutes of CPR/1 defibrillation/1 epinephrine.  Postarrest EKG revealed lateral ST elevation.  Code STEMI was called and he was transferred to [facility] for emergent cardiac catheterization, found to have thrombotic occlusion of mid circumflex, was treated with successful PTCA/DES x 2 proximal/mid circumflex and balloon angioplasty to ostium of OM2. There was Mild plaque in the LAD and RCA. Echo from 12/22/21 showed LVEF 50-55%, grade I DD, normal RV, trivial MR, aortic sclerosis.  He was recommended to stop OTC testosterone supplements as well as weight loss supplements, start aspirin and Brilinta for 1 year, Lipitor 80 mg, and metoprolol XL 50 mg for medical therapy at the time of discharge.    He followed up with Ms. Todd [name] APP in the office 12/27/21, he reported 5-second lightheadedness during the past week while watching his kids at the gym.  He was scared.  He has been walking 13 minutes twice daily.  He wanted to go back to weightlifting.  He has been  calling the office multiple times 12/28/2021 reporting left arm soreness, 12/29/2021 reporting chest pressure and shoulder blade/back.  He was advised going to the ER.   Patient stated he had 5 second lightheadedness last week while talking to someone.  He was nervous as this reminded him about his heart attack, where he also experienced lightheadedness.  He called office 12/28/2021 reporting left arm shortness.  He had IV placed to antecubital and radial site.  He stated left arm is slightly sore with mild swelling, there was no significant redness, warmth, sharp pain.  12/28/2021 night he was getting ready to go to bed, he started having midsternal chest pain and bilateral shoulder blade pain.  Pain was similar to his heart attack, but much mild, 2/10.  Pain lasted all night and all day 12/29/2021.  He feels mildly short of breath as well.  He was extremely worried, stated he has been crying in the ER over the past few hours after he heard his troponin was abnormal.  He agreed that he was having a lots of stress and anxiety since his cardiac arrest event, he was worried about his wife and children as he is the main caregiver, he was worried if he would have another cardiac arrest. He had been taking all of his medication religiously.  He denied any tobacco use.  His wife was at bedside, brought records of his blood pressure at home, SBP range 106-120s on average.  He had been tolerating activity, walking up to 15 minutes daily  without experiencing any angina.  He had stopped taking numerous over-the-counter hormonal supplements.   Admission diagnostic revealed unremarkable grossly unremarkable CBC differential and BMP.  High sensitive troponin 160 >156.  Chest x-ray showed no evidence of acute finding. EKG showed sinus rhythm with ventricular rate of 66 bpm, T wave inversion of aVL, poor R wave progression, overall unchanged from previous post cath EKG. He was afebrile, mildly hypertensive with blood pressure up to  151/102, otherwise hemodynamically stable at ED. Cardiology is consulted for further evaluation of chest pain.     Hospital Course     Consultants: N/A   Chest pain CAD with lateral STEMI 12/21/2021 s/p DES to proximal/mid circumflex Anxiety -Presented with midsternal and bilateral shoulder blade pain 2/10, similar to previous MI, with associated shortness of breath, in the setting of increased anxiety and stress following recent hospitalization for cardiac arrest and MI - Hs trop 160 >156, likely downtrending from recent STEMI - EKG revealed no significant ischemic change - Chest pain is not pleuritic in nature, there is no significant murmur suggesting acute MR, there is no significant arrhythmia on telemetry, he appears euvolemic, at this time low suspicion for post MI acute complications -Discussed with patient that likely he is experiencing significant anxiety, possible a component of PTSD with recent cardiac arrest/MI, causing his current presentation.  He agrees that he is having lots of anxiety and stress is the main caregiver for the household.  Support has been provided at bedside.  Encouraged the patient to communicate his feelings with family and friends, allow time for healing, medical therapy for anxiety if needed, start PRN hydroxyzine -No acute events noted from overnight observation -Continue medical therapy with DAPT including aspirin 81 and Brilinta 90 mg twice daily, reducing metoprolol XL from 50 mg to 25mg  daily due to sinus bradycardia 50s noted, and Lipitor 80 mg daily - Cr elevated 1.28 today, was 1.08-1.1 range, no dysuria or urinary frequency or oliguria, advised the patient to maintain adequate PO hydration and monitor urinary symptoms, call PCP if there is further concern  -Todd [name] discharge home today, follow-up with Dr. Angelena [name] scheduled on 02/19/2022  Sinus bradycardia -- noted HR 50s on monitor, no high grade AVB, no symptoms, Todd [name] reduce Metoprolol XL from 50 to  25mg  daily , new script sent    Type 2 diabetes -Continue home medicine metformin -[facility] diet   Hypothyroidism -Continue home medicine levothyroxine   DVT prophylaxis  - SQ heparin was provided    Did the patient have an acute coronary syndrome (MI, NSTEMI, STEMI, etc) this admission?:  No                               Did the patient have a percutaneous coronary intervention (stent / angioplasty)?:  No.          _____________  Discharge Vitals Blood pressure 120/79, pulse (!) 53, temperature 98.2 F (36.8 C), temperature source Oral, resp. rate 20, height 6' (1.829 m), weight 112.4 kg, SpO2 95 %.  Filed Weights   12/29/21 0959 12/30/21 0406  Weight: 111.1 kg 112.4 kg   Vitals:  Vitals:   12/30/21 0406 12/30/21 0712  BP: 120/77 120/79  Pulse: (!) 51 (!) 53  Resp: 16 20  Temp: 98 F (36.7 C) 98.2 F (36.8 C)  SpO2: 96% 95%   General Appearance: In no apparent distress, laying in bed HEENT: Normocephalic, atraumatic.  Neck: Supple, trachea midline,  no JVDs Cardiovascular: Regular rate and rhythm, normal S1-S2,  no murmur/rub/gallop, S3/S4 Respiratory: Resting breathing unlabored, lungs sounds clear to auscultation bilaterally, no use of accessory muscles. On room air.  No wheezes, rales or rhonchi.   Gastrointestinal: Bowel sounds positive, abdomen soft Extremities: Able to move all extremities in bed without difficulty, no edema of BLE Genitourinary: Clear yellow urine noted in urinal  Musculoskeletal: Normal muscle bulk and tone Skin: Intact, warm, dry. No rashes or petechiae noted in exposed areas.  Neurologic: Alert, oriented to person, place and time. Fluent speech, no facial droop, no cognitive deficit Psychiatric: Calm      Labs & Radiologic Studies    CBC Recent Labs    12/29/21 1025 12/29/21 1700 12/30/21 0323  WBC 6.8 8.5 7.5  NEUTROABS 4.8  --   --   HGB 17.5* 17.7* 16.9  HCT 51.3 51.0 49.3  MCV 95.5 93.8 94.1  PLT 262 260 053   Basic  Metabolic Panel Recent Labs    12/29/21 1025 12/29/21 1700 12/30/21 0323  NA 137  --  141  K 4.3  --  4.0  CL 105  --  106  CO2 24  --  26  GLUCOSE 106*  --  89  BUN 20  --  18  CREATININE 1.10 1.08 1.28*  CALCIUM 9.1  --  9.0   Liver Function Tests No results for input(s): "AST", "ALT", "ALKPHOS", "BILITOT", "PROT", "ALBUMIN" in the last 72 hours. No results for input(s): "LIPASE", "AMYLASE" in the last 72 hours. High Sensitivity Troponin:   Recent Labs  Lab 12/21/21 0932 12/21/21 1120 12/29/21 1025 12/29/21 1330  TROPONINIHS 9 27* 160* 156*    BNP Invalid input(s): "POCBNP" D-Dimer No results for input(s): "DDIMER" in the last 72 hours. Hemoglobin A1C No results for input(s): "HGBA1C" in the last 72 hours. Fasting Lipid Panel No results for input(s): "CHOL", "HDL", "LDLCALC", "TRIG", "CHOLHDL", "LDLDIRECT" in the last 72 hours. Thyroid Function Tests No results for input(s): "TSH", "T4TOTAL", "T3FREE", "THYROIDAB" in the last 72 hours.  Invalid input(s): "FREET3" _____________  DG Chest 2 View  Result Date: 12/29/2021 CLINICAL DATA:  chest pain EXAM: CHEST - 2 VIEW COMPARISON:  Radiograph 12/21/2021 FINDINGS: The cardiomediastinal silhouette is within normal limits. There is no focal airspace consolidation. There is no pleural effusion. No pneumothorax. There is no acute osseous abnormality. IMPRESSION: No evidence of acute cardiopulmonary disease. Electronically Signed   By: Maurine Simmering M.D.   On: 12/29/2021 10:49   ECHOCARDIOGRAM COMPLETE  Result Date: 12/22/2021    ECHOCARDIOGRAM REPORT   Patient Name:   Todd [name] Date of Exam: 12/22/2021 Medical Rec #:  [id number]      Height:       72.0 in Accession #:    [id number]     Weight:       245.0 lb Date of Birth:  June 04, 1975       BSA:          2.322 m Patient Age:    78 years       BP:           132/86 mmHg Patient Gender: M              HR:           62 bpm. Exam Location:  Inpatient Procedure: 2D Echo, 3D Echo,  Cardiac Doppler and Color Doppler Indications:    I25.110 Atherosclerotic heart disease of native coronary artery  with unstable angina pectoris  History:        Patient has no prior history of Echocardiogram examinations.                 Acute MI, Abnormal ECG, Arrythmias:Cardiac Arrest; Risk                 Factors:Dyslipidemia.  Sonographer:    Roseanna Rainbow RDCS Referring Phys: Burnell Blanks  Sonographer Comments: Technically difficult study due to poor echo windows. Image acquisition challenging due to patient body habitus. IMPRESSIONS  1. Hypokinesis of the inferolateral wall with overall low normal LV function.  2. Left ventricular ejection fraction, by estimation, is 50 to 55%. The left ventricle has low normal function. The left ventricle demonstrates regional wall motion abnormalities (see scoring diagram/findings for description). Left ventricular diastolic  parameters are consistent with Grade I diastolic dysfunction (impaired relaxation).  3. Right ventricular systolic function is normal. The right ventricular size is normal.  4. The mitral valve is normal in structure. Trivial mitral valve regurgitation. No evidence of mitral stenosis.  5. The aortic valve is tricuspid. Aortic valve regurgitation is not visualized. Aortic valve sclerosis is present, with no evidence of aortic valve stenosis.  6. The inferior vena cava is normal in size with greater than 50% respiratory variability, suggesting right atrial pressure of 3 mmHg. Comparison(s): No prior Echocardiogram. FINDINGS  Left Ventricle: Left ventricular ejection fraction, by estimation, is 50 to 55%. The left ventricle has low normal function. The left ventricle demonstrates regional wall motion abnormalities. The left ventricular internal cavity size was normal in size. There is no left ventricular hypertrophy. Left ventricular diastolic parameters are consistent with Grade I diastolic dysfunction (impaired relaxation). Right  Ventricle: The right ventricular size is normal. Right ventricular systolic function is normal. Left Atrium: Left atrial size was normal in size. Right Atrium: Right atrial size was normal in size. Pericardium: There is no evidence of pericardial effusion. Mitral Valve: The mitral valve is normal in structure. Mild mitral annular calcification. Trivial mitral valve regurgitation. No evidence of mitral valve stenosis. Tricuspid Valve: The tricuspid valve is normal in structure. Tricuspid valve regurgitation is trivial. No evidence of tricuspid stenosis. Aortic Valve: The aortic valve is tricuspid. Aortic valve regurgitation is not visualized. Aortic valve sclerosis is present, with no evidence of aortic valve stenosis. Pulmonic Valve: The pulmonic valve was normal in structure. Pulmonic valve regurgitation is not visualized. No evidence of pulmonic stenosis. Aorta: The aortic root is normal in size and structure. Venous: The inferior vena cava is normal in size with greater than 50% respiratory variability, suggesting right atrial pressure of 3 mmHg. IAS/Shunts: No atrial level shunt detected by color flow Doppler. Additional Comments: Hypokinesis of the inferolateral wall with overall low normal LV function.  LEFT VENTRICLE PLAX 2D LVIDd:         5.10 cm     Diastology LVIDs:         3.70 cm     LV e' medial:    6.31 cm/s LV PW:         1.00 cm     LV E/e' medial:  12.5 LV IVS:        1.00 cm     LV e' lateral:   7.18 cm/s LVOT diam:     2.30 cm     LV E/e' lateral: 11.0 LV SV:         85 LV SV Index:   37 LVOT Area:  4.15 cm                             3D Volume EF: LV Volumes (MOD)           3D EF:        49 % LV vol d, MOD A2C: 69.6 ml LV EDV:       134 ml LV vol d, MOD A4C: 93.4 ml LV ESV:       69 ml LV vol s, MOD A2C: 32.6 ml LV SV:        65 ml LV vol s, MOD A4C: 44.0 ml LV SV MOD A2C:     37.0 ml LV SV MOD A4C:     93.4 ml LV SV MOD BP:      43.4 ml RIGHT VENTRICLE            IVC RV S prime:     7.65  cm/s  IVC diam: 2.20 cm TAPSE (M-mode): 1.5 cm LEFT ATRIUM             Index        RIGHT ATRIUM          Index LA diam:        3.80 cm 1.64 cm/m   RA Area:     9.02 cm LA Vol (A2C):   27.2 ml 11.71 ml/m  RA Volume:   15.40 ml 6.63 ml/m LA Vol (A4C):   20.6 ml 8.87 ml/m LA Biplane Vol: 26.4 ml 11.37 ml/m  AORTIC VALVE LVOT Vmax:   119.00 cm/s LVOT Vmean:  77.550 cm/s LVOT VTI:    0.205 m  AORTA Ao Root diam: 3.50 cm Ao Asc diam:  3.50 cm MITRAL VALVE MV Area (PHT): 4.79 cm    SHUNTS MV Decel Time: 159 msec    Systemic VTI:  0.20 m MV E velocity: 78.90 cm/s  Systemic Diam: 2.30 cm MV A velocity: 84.65 cm/s MV E/A ratio:  0.93 Todd Ruths MD Electronically signed by Todd Ruths MD Signature Date/Time: 12/22/2021/3:05:07 PM    Final    CARDIAC CATHETERIZATION  Result Date: 12/21/2021   Prox RCA lesion is 20% stenosed.   Prox Cx lesion is 100% stenosed.   Mid LAD lesion is 20% stenosed.   Ost Cx to Prox Cx lesion is 80% stenosed.   A drug-eluting stent was successfully placed using a SYNERGY XD 3.0X24.   A drug-eluting stent was successfully placed using a SYNERGY XD 3.50X28.   Post intervention, there is a 0% residual stenosis.   Post intervention, there is a 0% residual stenosis. Acute lateral STEMI secondary to thrombotic occlusion of the mid Circumflex Successful PTCA/DES x 2 proximal/mid circumflex. Successful balloon angioplasty only ostium of OM2 Mild plaque in the LAD and RCA LVEDP 24 mmHg Recommendations: Todd [name] admit to [place]. Continue DAPT with ASA and Brilinta for one year. High intensity statin. Wean Levophed as tolerated. Echo tomorrow.   CT Angio Chest/Abd/Pel for Dissection W and/or Wo Contrast  Result Date: 12/21/2021 CLINICAL DATA:  Acute chest and abdominal pain. EXAM: CT ANGIOGRAPHY CHEST, ABDOMEN AND PELVIS TECHNIQUE: Non-contrast CT of the chest was initially obtained. Multidetector CT imaging through the chest, abdomen and pelvis was performed using the standard protocol during bolus  administration of intravenous contrast. Multiplanar reconstructed images and MIPs were obtained and reviewed to evaluate the vascular anatomy. RADIATION DOSE REDUCTION: This exam was performed according to the departmental dose-optimization  program which includes automated exposure control, adjustment of the mA and/or kV according to patient size and/or use of iterative reconstruction technique. CONTRAST:  122mL OMNIPAQUE IOHEXOL 350 MG/ML SOLN COMPARISON:  CT abdomen/pelvis 10/09/2020 FINDINGS: CTA CHEST FINDINGS Cardiovascular: The heart is normal in size. No pericardial effusion. The aorta is normal in caliber. No dissection. No atherosclerotic calcifications. The branch vessels are patent. No coronary artery calcifications. The pulmonary arteries are grossly normal. Mediastinum/Nodes: No mediastinal or hilar mass or lymphadenopathy. The esophagus is unremarkable. Lungs/Pleura: No acute pulmonary findings. No pulmonary edema, pleural effusions or pulmonary infiltrates. No interstitial lung disease or bronchiectasis. Calcified left hilar lymph nodes and left upper lobe calcified granuloma likely related to remote granulomatous disease. Musculoskeletal: No significant bony findings. Review of the MIP images confirms the above findings. CTA ABDOMEN AND PELVIS FINDINGS VASCULAR Aorta: Normal Celiac: Normal SMA: Normal Renals: Normal IMA: Normal Inflow: Normal Veins: Normal Review of the MIP images confirms the above findings. NON-VASCULAR Hepatobiliary: No hepatic lesions or intrahepatic biliary dilatation. The gallbladder is unremarkable. No common bile duct dilatation. Pancreas: No mass, inflammation or ductal dilatation. Spleen: Normal size. Small scattered calcified granulomas. Adrenals/Urinary Tract: Adrenal glands and kidneys are. The bladder is unremarkable. Stomach/Bowel: The stomach, duodenum, small bowel and colon are unremarkable. No acute inflammatory changes, mass lesions or obstructive findings. The  terminal ileum and appendix are normal. Scattered colonic diverticulosis. Lymphatic: No abdominal or pelvic lymphadenopathy. Reproductive: The prostate gland and seminal vesicles are normal. Other: No pelvic mass or adenopathy. No free pelvic fluid collections. No inguinal mass or adenopathy. No abdominal wall hernia or subcutaneous lesions. Musculoskeletal: No significant bony findings. Review of the MIP images confirms the above findings. IMPRESSION: 1. Normal thoracic and abdominal aorta. No aneurysm or dissection. 2. No acute pulmonary findings. 3. Remote granulomatous disease involving the left lung and spleen. 4. No acute abdominal/pelvic findings, mass lesions or lymphadenopathy. Electronically Signed   By: Marijo Sanes M.D.   On: 12/21/2021 11:16   DG Chest 2 View  Result Date: 12/21/2021 CLINICAL DATA:  Chest pain EXAM: CHEST - 2 VIEW COMPARISON:  08/15/2018 FINDINGS: The heart size and mediastinal contours are within normal limits. Both lungs are clear. The visualized skeletal structures are unremarkable. IMPRESSION: No active cardiopulmonary disease. Electronically Signed   By: Elmer Picker M.D.   On: 12/21/2021 10:00    Disposition   Patient was seen by Dr. Curt Bears, deemed stable for discharge home today.  All discharge instructions given at bedside.  Questions answered to satisfaction.  Pt is being discharged home today in good condition.  Follow-up Plans & Appointments     Follow-up Information     Burnell Blanks, MD Follow up on 02/19/2022.   Specialty: Cardiology Why: at 2:40 PM for your cardiology appointment Contact information: [place]  [address] [phone]                02/19/2022 with cardiology Discharge Instructions     Diet - low sodium heart healthy   Complete by: As directed    Discharge instructions   Complete by: As directed    Please continue monitor blood pressure and heart rate at home, bring logs to  next follow-up appointment  Please maintain adequate PO hydration, monitor for low urine output, painful urination, urinary frequency, blood in the uirne, etc. Call your PCP if there is concern.   Please call our office if you have concerns  Please follow-up 02/19/2022 with cardiology  Increase activity slowly   Complete by: As directed        Discharge Medications   Allergies as of 12/30/2021   No Known Allergies      Medication List     STOP taking these medications    5-MTHF PO   K2 PO   L-METHIONINE PO   RESVERATROL PO       TAKE these medications    acetaminophen 325 MG tablet Commonly known as: TYLENOL Take 2 tablets (650 mg total) by mouth every 4 (four) hours as needed for headache or mild pain.   aspirin 81 MG chewable tablet Chew 1 tablet (81 mg total) by mouth daily.   atorvastatin 80 MG tablet Commonly known as: LIPITOR Take 1 tablet (80 mg total) by mouth daily. What changed: when to take this   B-COMPLEX/B-12 PO Take 1 tablet by mouth daily.   D3 5000 PO Take 1 tablet by mouth daily.   levothyroxine 112 MCG tablet Commonly known as: SYNTHROID Take 112 mcg by mouth daily before breakfast.   MAGNESIUM PO Take 1 tablet by mouth daily.   metFORMIN 500 MG tablet Commonly known as: GLUCOPHAGE Take 500 mg by mouth daily.   metoprolol succinate 25 MG 24 hr tablet Commonly known as: TOPROL-XL Take 1 tablet (25 mg total) by mouth daily. What changed:  medication strength how much to take additional instructions   multivitamin tablet Take 1 tablet by mouth daily.   NICOTINAMIDE PO Take 1 capsule by mouth daily.   nitroGLYCERIN 0.4 MG SL tablet Commonly known as: NITROSTAT Place 1 tablet (0.4 mg total) under the tongue every 5 (five) minutes as needed for chest pain.   OMEGA-3 PLUS PO Take 1 capsule by mouth daily.   ticagrelor 90 MG Tabs tablet Commonly known as: BRILINTA Take 1 tablet (90 mg total) by mouth 2 (two) times  daily.   zinc gluconate 50 MG tablet Take 50 mg by mouth daily.           Outstanding Labs/Studies   N/A   Duration of Discharge Encounter   Greater than 30 minutes including physician time.  Signed, Margie Billet, NP 12/30/2021, 8:55 AM    I have seen and examined this patient with Margie Billet.  Agree with above, note added to reflect my findings.  Patient admitted to the hospital after having chest discomfort and lightheadedness.  He has a history of STEMI complicated by cardiac arrest.  The patient was quite anxious about this episode worrying that he would be at risk of cardiac arrest again.  Troponins were mildly elevated but trending down.  It was thought that his elevated troponins were due to his arrest last week.  Currently the patient is feeling well.  He understands that this is likely due to anxiety.  He is ready to return home.  GEN: Well nourished, well developed, in no acute distress  HEENT: normal  Neck: no JVD, carotid bruits, or masses Cardiac: RRR; no murmurs, rubs, or gallops,no edema  Respiratory:  clear to auscultation bilaterally, normal work of breathing GI: soft, nontender, nondistended, + BS MS: no deformity or atrophy  Skin: warm and dry Neuro:  Strength and sensation are intact Psych: euthymic mood, full affect    Shawnika Pepin M. Clariece Roesler MD 12/30/2021 10:01 AM

## 2022-01-04 ENCOUNTER — Telehealth: Payer: Self-pay | Admitting: Cardiovascular Disease

## 2022-01-04 NOTE — Telephone Encounter (Signed)
Pt c/o medication issue:  1. Name of Medication:  metoprolol succinate (TOPROL-XL) 25 MG 24 hr tablet  2. How are you currently taking this medication (dosage and times per day)?  As prescribed  3. Are you having a reaction (difficulty breathing--STAT)?   4. What is your medication issue?   Patient questions whether or not he needs to be on this medication at all. He states his HR is averaging around 47 at rest. Walking around the house his HR is around 57, but it isn't getting above 60. Most recent reading last night around 10:30 PM was 56, BP was 130/75. Patient is asymptomatic, states he feels fine, but worries about continuing on Metoprolol.

## 2022-01-05 NOTE — Telephone Encounter (Signed)
Spoke with pt and has concerns re low HR Per pt while sleeping is averaging 46 and normal resting is averaging 52 Pt has been researching and reading material on low HR and feels this is to low Per pt no dizziness at this time Will forward to Dr Angelena Form for review .Adonis Housekeeper

## 2022-01-05 NOTE — Telephone Encounter (Signed)
Pt aware of recommendations from Dr Angelena Form .Adonis Housekeeper

## 2022-01-05 NOTE — Telephone Encounter (Signed)
Pt calling back for an update. He states that his hr is still concerning to him. Please advise.

## 2022-01-09 ENCOUNTER — Telehealth: Payer: Self-pay | Admitting: Internal Medicine

## 2022-01-09 ENCOUNTER — Encounter (HOSPITAL_COMMUNITY): Payer: Self-pay | Admitting: Emergency Medicine

## 2022-01-09 ENCOUNTER — Emergency Department (HOSPITAL_COMMUNITY): Payer: BC Managed Care – PPO

## 2022-01-09 ENCOUNTER — Other Ambulatory Visit: Payer: Self-pay

## 2022-01-09 ENCOUNTER — Observation Stay (HOSPITAL_COMMUNITY)
Admission: EM | Admit: 2022-01-09 | Discharge: 2022-01-11 | Disposition: A | Discharge status: Home / Self Care | Payer: BC Managed Care – PPO | Attending: Internal Medicine | Admitting: Internal Medicine

## 2022-01-09 DIAGNOSIS — I10 Essential (primary) hypertension: Secondary | ICD-10-CM | POA: Diagnosis not present

## 2022-01-09 DIAGNOSIS — Z7984 Long term (current) use of oral hypoglycemic drugs: Secondary | ICD-10-CM | POA: Insufficient documentation

## 2022-01-09 DIAGNOSIS — R079 Chest pain, unspecified: Secondary | ICD-10-CM | POA: Diagnosis not present

## 2022-01-09 DIAGNOSIS — R778 Other specified abnormalities of plasma proteins: Secondary | ICD-10-CM

## 2022-01-09 DIAGNOSIS — E039 Hypothyroidism, unspecified: Secondary | ICD-10-CM | POA: Diagnosis not present

## 2022-01-09 DIAGNOSIS — I2129 ST elevation (STEMI) myocardial infarction involving other sites: Secondary | ICD-10-CM | POA: Diagnosis present

## 2022-01-09 DIAGNOSIS — I501 Left ventricular failure: Secondary | ICD-10-CM | POA: Insufficient documentation

## 2022-01-09 DIAGNOSIS — I4729 Other ventricular tachycardia: Secondary | ICD-10-CM | POA: Diagnosis not present

## 2022-01-09 DIAGNOSIS — F418 Other specified anxiety disorders: Secondary | ICD-10-CM | POA: Diagnosis present

## 2022-01-09 DIAGNOSIS — R7989 Other specified abnormal findings of blood chemistry: Secondary | ICD-10-CM | POA: Diagnosis not present

## 2022-01-09 DIAGNOSIS — Z8674 Personal history of sudden cardiac arrest: Secondary | ICD-10-CM

## 2022-01-09 DIAGNOSIS — Z79899 Other long term (current) drug therapy: Secondary | ICD-10-CM | POA: Diagnosis not present

## 2022-01-09 DIAGNOSIS — Z7982 Long term (current) use of aspirin: Secondary | ICD-10-CM | POA: Diagnosis not present

## 2022-01-09 DIAGNOSIS — I2510 Atherosclerotic heart disease of native coronary artery without angina pectoris: Secondary | ICD-10-CM | POA: Insufficient documentation

## 2022-01-09 DIAGNOSIS — Z955 Presence of coronary angioplasty implant and graft: Secondary | ICD-10-CM | POA: Insufficient documentation

## 2022-01-09 DIAGNOSIS — Z95820 Peripheral vascular angioplasty status with implants and grafts: Secondary | ICD-10-CM

## 2022-01-09 DIAGNOSIS — F410 Panic disorder [episodic paroxysmal anxiety] without agoraphobia: Secondary | ICD-10-CM | POA: Diagnosis present

## 2022-01-09 DIAGNOSIS — R072 Precordial pain: Secondary | ICD-10-CM | POA: Diagnosis not present

## 2022-01-09 DIAGNOSIS — R0789 Other chest pain: Secondary | ICD-10-CM | POA: Diagnosis not present

## 2022-01-09 DIAGNOSIS — E785 Hyperlipidemia, unspecified: Secondary | ICD-10-CM | POA: Diagnosis present

## 2022-01-09 LAB — CBC WITH DIFFERENTIAL/PLATELET
Abs Immature Granulocytes: 0.03 10*3/uL (ref 0.00–0.07)
Basophils Absolute: 0 10*3/uL (ref 0.0–0.1)
Basophils Relative: 0 %
Eosinophils Absolute: 0.1 10*3/uL (ref 0.0–0.5)
Eosinophils Relative: 1 %
HCT: 49.1 % (ref 39.0–52.0)
Hemoglobin: 16.8 g/dL (ref 13.0–17.0)
Immature Granulocytes: 0 %
Lymphocytes Relative: 12 %
Lymphs Abs: 1.3 10*3/uL (ref 0.7–4.0)
MCH: 32.7 pg (ref 26.0–34.0)
MCHC: 34.2 g/dL (ref 30.0–36.0)
MCV: 95.5 fL (ref 80.0–100.0)
Monocytes Absolute: 0.7 10*3/uL (ref 0.1–1.0)
Monocytes Relative: 7 %
Neutro Abs: 8.7 10*3/uL — ABNORMAL HIGH (ref 1.7–7.7)
Neutrophils Relative %: 80 %
Platelets: 202 10*3/uL (ref 150–400)
RBC: 5.14 MIL/uL (ref 4.22–5.81)
RDW: 12 % (ref 11.5–15.5)
WBC: 10.9 10*3/uL — ABNORMAL HIGH (ref 4.0–10.5)
nRBC: 0 % (ref 0.0–0.2)

## 2022-01-09 LAB — COMPREHENSIVE METABOLIC PANEL
ALT: 32 U/L (ref 0–44)
AST: 29 U/L (ref 15–41)
Albumin: 4.2 g/dL (ref 3.5–5.0)
Alkaline Phosphatase: 52 U/L (ref 38–126)
Anion gap: 13 (ref 5–15)
BUN: 16 mg/dL (ref 6–20)
CO2: 23 mmol/L (ref 22–32)
Calcium: 9.2 mg/dL (ref 8.9–10.3)
Chloride: 102 mmol/L (ref 98–111)
Creatinine, Ser: 1.08 mg/dL (ref 0.61–1.24)
GFR, Estimated: >60 mL/min (ref >60)
Glucose, Bld: 112 mg/dL — ABNORMAL HIGH (ref 70–99)
Potassium: 3.8 mmol/L (ref 3.5–5.1)
Sodium: 138 mmol/L (ref 135–145)
Total Bilirubin: 0.9 mg/dL (ref 0.3–1.2)
Total Protein: 7 g/dL (ref 6.5–8.1)

## 2022-01-09 LAB — TROPONIN I (HIGH SENSITIVITY): Troponin I (High Sensitivity): 19 ng/L — ABNORMAL HIGH (ref <18)

## 2022-01-09 LAB — LIPASE, BLOOD: Lipase: 35 U/L (ref 11–51)

## 2022-01-09 NOTE — ED Provider Triage Note (Signed)
Emergency Medicine Provider Triage Evaluation Note  Todd [name] , a 46 y.o. male  was evaluated in triage.  Pt complains of chest pain and pins and needles sensation to his chest, abdomen, and entire face.  Denies focal numbness/weakness.  Took a NTG prior to arrival without change.  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 Has been compliant with his ASA and brilinta.  Review of Systems  Positive: Chest pain Negative: fever  Physical Exam  BP (!) 141/89 (BP Location: Right Arm)   Pulse 81   Temp 99.5 F (37.5 C) (Oral)   Resp 18   SpO2 94%  Gen:   Awake, no distress   Resp:  Normal effort  MSK:   Moves extremities without difficulty  Other:  AAOx3, moving extremities well, speech clear, ambulatory with steady gait  Medical Decision Making  Medically screening exam initiated at 10:27 PM.  Appropriate orders placed.  Todd [name] was informed that the remainder of the evaluation will be completed by another provider, this initial triage assessment does not replace that evaluation, and the importance of remaining in the ED until their evaluation is complete.  Chest pain.  No focal deficits in triage related to pins and needles sensation.  EKG, labs, CXR.   Larene Pickett, PA-C 01/09/22 2237

## 2022-01-09 NOTE — ED Provider Notes (Incomplete)
[facility] EMERGENCY DEPARTMENT Provider Note   CSN: [id number] Arrival date & time: 01/09/22  2200     History {Add pertinent medical, surgical, social history, OB history to HPI:1} Chief Complaint  Patient presents with  . Chest Pain    Todd [name] is a 46 y.o. male.   Chest Pain Associated symptoms: abdominal pain    Patient is a 46 year old male with past medical history of hyperlipidemia, hypothyroidism, STEMI status post catheterization on 09/07 presents to emergency department for evaluation of chest pain.  Patient states that he experienced intermittent pins and needle feeling on his left chest and epigastric area.  He states starting around 6pm today this feeling become more intense with numbness on his face and hands.  He reports pain on the left side of the chest and epigastric area which he describes as dull and constant, worse with inspiration.  Patient had taken nitro 0.4 mg prior to ED arrival with no relief.  He noted since yesterday his blood pressure has been going up in the 130s to 160s and heart rate peaked to 100s he reports a small bruise on his lower left abdomen which is new and the cause is unknown.  He denies fever, nausea, vomiting, dizziness, abnormal bowel changes.  Of note patient was admitted to the hospital on September 15 for chest pain which was attributed to anxiety possibly a component of PTSD with recent cardiac arrest/MI.    Home Medications Prior to Admission medications   Medication Sig Start Date End Date Taking? Authorizing Provider  acetaminophen (TYLENOL) 325 MG tablet Take 2 tablets (650 mg total) by mouth every 4 (four) hours as needed for headache or mild pain. 12/23/21   Isaiah Serge, NP  aspirin 81 MG chewable tablet Chew 1 tablet (81 mg total) by mouth daily. 12/24/21   Isaiah Serge, NP  atorvastatin (LIPITOR) 80 MG tablet Take 1 tablet (80 mg total) by mouth daily. Patient taking differently: Take 80 mg by mouth  at bedtime. 12/24/21   Isaiah Serge, NP  B Complex Vitamins (B-COMPLEX/B-12 PO) Take 1 tablet by mouth daily.    [provider]  Cholecalciferol (D3 5000 PO) Take 1 tablet by mouth daily.    [provider]  levothyroxine (SYNTHROID) 112 MCG tablet Take 112 mcg by mouth daily before breakfast.    [provider]  MAGNESIUM PO Take 1 tablet by mouth daily.    [provider]  metFORMIN (GLUCOPHAGE) 500 MG tablet Take 500 mg by mouth daily. 08/24/21   [provider]  metoprolol succinate (TOPROL-XL) 25 MG 24 hr tablet Take 1 tablet (25 mg total) by mouth daily. 12/30/21   Margie Billet, NP  Multiple Vitamin (MULTIVITAMIN) tablet Take 1 tablet by mouth daily.    [provider]  Niacinamide-Zn-Cu-Methfo-Se-Cr (NICOTINAMIDE PO) Take 1 capsule by mouth daily.    [provider]  nitroGLYCERIN (NITROSTAT) 0.4 MG SL tablet Place 1 tablet (0.4 mg total) under the tongue every 5 (five) minutes as needed for chest pain. 12/23/21   Isaiah Serge, NP  Omega-3 Fatty Acids (OMEGA-3 PLUS PO) Take 1 capsule by mouth daily.    [provider]  ticagrelor (BRILINTA) 90 MG TABS tablet Take 1 tablet (90 mg total) by mouth 2 (two) times daily. 12/23/21   Isaiah Serge, NP  zinc gluconate 50 MG tablet Take 50 mg by mouth daily.    [provider]      Allergies  Patient has no known allergies.    Review of Systems   Review of Systems  HENT:         Paresthesia on face  Cardiovascular:  Positive for chest pain.  Gastrointestinal:  Positive for abdominal pain.    Physical Exam Updated Vital Signs BP (!) 141/89 (BP Location: Right Arm)   Pulse 81   Temp 99.5 F (37.5 C) (Oral)   Resp 18   SpO2 94%  Physical Exam Vitals and nursing note reviewed.  Constitutional:      General: He is not in acute distress.    Appearance: He is well-developed.  HENT:     Head: Normocephalic and atraumatic.  Eyes:     Conjunctiva/sclera:  Conjunctivae normal.  Cardiovascular:     Rate and Rhythm: Normal rate and regular rhythm.     Heart sounds: No murmur heard. Pulmonary:     Effort: Pulmonary effort is normal. No respiratory distress.     Breath sounds: Normal breath sounds.  Chest:     Chest wall: Tenderness (sternum and left chest) present.  Abdominal:     Palpations: Abdomen is soft.     Tenderness: There is abdominal tenderness (epigastric area).  Musculoskeletal:        General: No swelling.     Cervical back: Neck supple.  Skin:    General: Skin is warm and dry.     Capillary Refill: Capillary refill takes less than 2 seconds.     Findings: Ecchymosis: 5x4 cm on LLQ.  Neurological:     Mental Status: He is alert.  Psychiatric:        Mood and Affect: Mood normal.     ED Results / Procedures / Treatments   Labs (all labs ordered are listed, but only abnormal results are displayed) Labs Reviewed  CBC WITH DIFFERENTIAL/PLATELET - Abnormal; Notable for the following components:      Result Value   WBC 10.9 (*)    Neutro Abs 8.7 (*)    All other components within normal limits  COMPREHENSIVE METABOLIC PANEL  LIPASE, BLOOD  TROPONIN I (HIGH SENSITIVITY)    EKG EKG Interpretation  Date/Time:  Tuesday January 09 2022 22:18:09 EDT Ventricular Rate:  79 PR Interval:  158 QRS Duration: 82 QT Interval:  368 QTC Calculation: 421 R Axis:   -17 Text Interpretation: Normal sinus rhythm with sinus arrhythmia Low voltage QRS Nonspecific T wave abnormality Abnormal ECG Confirmed by Ripley Fraise [phone]) on 01/09/2022 11:04:29 PM  Radiology No results found.  Procedures Procedures  {Document cardiac monitor, telemetry assessment procedure when appropriate:1}  Medications Ordered in ED Medications - No data to display  ED Course/ Medical Decision Making/ A&P                           Medical Decision Making  This patient presents to the ED for concern of chest pain, this involves an extensive  number of treatment options, and is a complaint that carries with it a high risk of complications and morbidity.  The differential diagnosis includes CAD, post MI complications, angina, non-STEMI, STEMI, pneumothorax, pericarditis, panic attack, GERD, gallbladder stone.   Co morbidities that complicate the patient evaluation  See HPI   Additional history obtained:  Additional history obtained from EMR External records from outside source obtained and reviewed including cardiac catheterization on September 7   Lab Tests:  I Ordered, and personally interpreted labs.  The pertinent results include: Troponin  elevated at 19. White count 10.9.  No evidence of anemia.  Platelets within normal range.  No electrolyte abnormalities noted.  Renal function within normal limits.  No transaminitis noted.    Imaging Studies ordered:  I ordered imaging studies including chest x-ray I independently visualized and interpreted imaging which showed no active cardiopulmonary disease I agree with the radiologist interpretation   Cardiac Monitoring: / EKG:  The patient was maintained on a cardiac monitor.  I personally viewed and interpreted the cardiac monitored which showed an underlying rhythm of: sinus rhythm   Consultations Obtained:  I requested consultation with the cardiologist,  and discussed lab and imaging findings as well as pertinent plan - they recommend: ***   Problem List / ED Course / Critical interventions / Medication management  *** Vitals signs significant for elevated blood pressure 141/89.  Patient reports blood pressure was up in the 160s previously today. Otherwise within normal range and stable throughout visit. Laboratory/imaging studies significant for: See above *** I ordered medication including ***  for ***  Reevaluation of the patient after these medicines showed that the patient {resolved/improved/worsened:23923::"improved"} I have reviewed the patients home  medicines and have made adjustments as needed    Social Determinants of Health:  N/a   Test / Admission - Considered:  Continued outpatient therapy with the same. Follow-up with *** recommended for reevaluation of symptoms. Treatment plan discussed with patient.  Pt acknowledged understanding was agreeable to the plan. Worrisome signs and symptoms were discussed with patient, and patient acknowledged understanding to return to the ED if they noticed these signs and symptoms. Patient was stable upon discharge.    {Document critical care time when appropriate:1} {Document review of labs and clinical decision tools ie heart score, Chads2Vasc2 etc:1}  {Document your independent review of radiology images, and any outside records:1} {Document your discussion with family members, caretakers, and with consultants:1} {Document social determinants of health affecting pt's care:1} {Document your decision making why or why not admission, treatments were needed:1} Final Clinical Impression(s) / ED Diagnoses Final diagnoses:  None    Rx / DC Orders ED Discharge Orders     None

## 2022-01-09 NOTE — ED Notes (Signed)
Provider at bedside

## 2022-01-09 NOTE — ED Provider Notes (Signed)
[facility] EMERGENCY DEPARTMENT Provider Note   CSN: [id number] Arrival date & time: 01/09/22  2200     History  Chief Complaint  Patient presents with   Chest Pain    Todd [name] is a 46 y.o. male.   Chest Pain Associated symptoms: abdominal pain    Patient is a 46 year old male with past medical history of hyperlipidemia, hypothyroidism, STEMI status post catheterization on 09/07 presents to emergency department for evaluation of chest pain.  Patient states that he experienced intermittent pins and needle feeling on his left chest and epigastric area.  He states starting around 6pm today this feeling become more intense with numbness on his face and hands.  He reports pain on the left side of the chest and epigastric area which he describes as dull and constant, worse with inspiration.  Patient had taken nitro 0.4 mg prior to ED arrival with no relief.  He noted since yesterday his blood pressure has been going up in the 130s to 160s and heart rate peaked to 100s he reports a small bruise on his lower left abdomen which is new and the cause is unknown.  He denies fever, nausea, vomiting, dizziness, abnormal bowel changes.  Of note patient was admitted to the hospital on September 15 for chest pain which was attributed to anxiety possibly a component of PTSD with recent cardiac arrest/MI.    Home Medications Prior to Admission medications   Medication Sig Start Date End Date Taking? Authorizing Provider  acetaminophen (TYLENOL) 325 MG tablet Take 2 tablets (650 mg total) by mouth every 4 (four) hours as needed for headache or mild pain. 12/23/21   Isaiah Serge, NP  aspirin 81 MG chewable tablet Chew 1 tablet (81 mg total) by mouth daily. 12/24/21   Isaiah Serge, NP  atorvastatin (LIPITOR) 80 MG tablet Take 1 tablet (80 mg total) by mouth daily. Patient taking differently: Take 80 mg by mouth at bedtime. 12/24/21   Isaiah Serge, NP  B Complex Vitamins  (B-COMPLEX/B-12 PO) Take 1 tablet by mouth daily.    [provider]  Cholecalciferol (D3 5000 PO) Take 1 tablet by mouth daily.    [provider]  levothyroxine (SYNTHROID) 112 MCG tablet Take 112 mcg by mouth daily before breakfast.    [provider]  MAGNESIUM PO Take 1 tablet by mouth daily.    [provider]  metFORMIN (GLUCOPHAGE) 500 MG tablet Take 500 mg by mouth daily. 08/24/21   [provider]  metoprolol succinate (TOPROL-XL) 25 MG 24 hr tablet Take 1 tablet (25 mg total) by mouth daily. 12/30/21   Margie Billet, NP  Multiple Vitamin (MULTIVITAMIN) tablet Take 1 tablet by mouth daily.    [provider]  Niacinamide-Zn-Cu-Methfo-Se-Cr (NICOTINAMIDE PO) Take 1 capsule by mouth daily.    [provider]  nitroGLYCERIN (NITROSTAT) 0.4 MG SL tablet Place 1 tablet (0.4 mg total) under the tongue every 5 (five) minutes as needed for chest pain. 12/23/21   Isaiah Serge, NP  Omega-3 Fatty Acids (OMEGA-3 PLUS PO) Take 1 capsule by mouth daily.    [provider]  ticagrelor (BRILINTA) 90 MG TABS tablet Take 1 tablet (90 mg total) by mouth 2 (two) times daily. 12/23/21   Isaiah Serge, NP  zinc gluconate 50 MG tablet Take 50 mg by mouth daily.    [provider]      Allergies    Patient has no known allergies.  Review of Systems   Review of Systems  HENT:         Paresthesia on face  Cardiovascular:  Positive for chest pain.  Gastrointestinal:  Positive for abdominal pain.    Physical Exam Updated Vital Signs BP (!) 141/89 (BP Location: Right Arm)   Pulse 81   Temp 99.5 F (37.5 C) (Oral)   Resp 18   SpO2 94%  Physical Exam Vitals and nursing note reviewed.  Constitutional:      General: He is not in acute distress.    Appearance: He is well-developed.  HENT:     Head: Normocephalic and atraumatic.  Eyes:     Conjunctiva/sclera: Conjunctivae normal.  Cardiovascular:     Rate and Rhythm:  Normal rate and regular rhythm.     Heart sounds: No murmur heard. Pulmonary:     Effort: Pulmonary effort is normal. No respiratory distress.     Breath sounds: Normal breath sounds.  Chest:     Chest wall: Tenderness (sternum and left chest) present.  Abdominal:     Palpations: Abdomen is soft.     Tenderness: There is abdominal tenderness (epigastric area).  Musculoskeletal:        General: No swelling.     Cervical back: Neck supple.  Skin:    General: Skin is warm and dry.     Capillary Refill: Capillary refill takes less than 2 seconds.     Findings: Ecchymosis: 5x4 cm on LLQ.  Neurological:     Mental Status: He is alert.  Psychiatric:        Mood and Affect: Mood normal.     ED Results / Procedures / Treatments   Labs (all labs ordered are listed, but only abnormal results are displayed) Labs Reviewed  CBC WITH DIFFERENTIAL/PLATELET - Abnormal; Notable for the following components:      Result Value   WBC 10.9 (*)    Neutro Abs 8.7 (*)    All other components within normal limits  COMPREHENSIVE METABOLIC PANEL - Abnormal; Notable for the following components:   Glucose, Bld 112 (*)    All other components within normal limits  TROPONIN I (HIGH SENSITIVITY) - Abnormal; Notable for the following components:   Troponin I (High Sensitivity) 19 (*)    All other components within normal limits  LIPASE, BLOOD  TROPONIN I (HIGH SENSITIVITY)    EKG EKG Interpretation  Date/Time:  Tuesday January 09 2022 22:18:09 EDT Ventricular Rate:  79 PR Interval:  158 QRS Duration: 82 QT Interval:  368 QTC Calculation: 421 R Axis:   -17 Text Interpretation: Normal sinus rhythm with sinus arrhythmia Low voltage QRS Nonspecific T wave abnormality Abnormal ECG Confirmed by Ripley Fraise [phone]) on 01/09/2022 11:04:29 PM  Radiology DG Chest 2 View  Result Date: 01/09/2022 CLINICAL DATA:  Chest pain EXAM: CHEST - 2 VIEW COMPARISON:  12/29/2021 FINDINGS: The heart size and  mediastinal contours are within normal limits. Both lungs are clear. The visualized skeletal structures are unremarkable. IMPRESSION: No active cardiopulmonary disease. Electronically Signed   By: Donavan Foil M.D.   On: 01/09/2022 23:23    Procedures Procedures    Medications Ordered in ED Medications - No data to display  ED Course/ Medical Decision Making/ A&P Clinical Course as of 01/10/22 4854  Wed Jan 10, 2022  0008 Spoke with Dr. Chalmers Cater of Cardiology. Cardiology recommending admission given recurrent chest pressure so close to his recent cardiac arrest/MI. Dr. Chalmers Cater to assess patient in the ED.  [  [facility] has completed bedside assessment. [KH]    Clinical Course User Index [KH] Antonietta Breach, PA-C                           Medical Decision Making Risk Decision regarding hospitalization.   This patient presents to the ED for concern of chest pain, this involves an extensive number of treatment options, and is a complaint that carries with it a high risk of complications and morbidity.  The differential diagnosis includes CAD, post MI complications, angina, non-STEMI, STEMI, pneumothorax, pericarditis, panic attack, GERD.   Co morbidities that complicate the patient evaluation  See HPI   Additional history obtained:  Additional history obtained from EMR External records from outside source obtained and reviewed including cardiac catheterization on September 7   Lab Tests:  I Ordered, and personally interpreted labs.  The pertinent results include: Troponin elevated at 19. White count 10.9.  No evidence of anemia.  Platelets within normal range.  No electrolyte abnormalities noted.  Renal function within normal limits.  No transaminitis noted.    Imaging Studies ordered:  I ordered imaging studies including chest x-ray I independently visualized and interpreted imaging which showed no active cardiopulmonary disease I agree with the radiologist  interpretation   Cardiac Monitoring: / EKG:  The patient was maintained on a cardiac monitor.  I personally viewed and interpreted the cardiac monitored which showed an underlying rhythm of: sinus rhythm   Consultations Obtained:  I requested consultation with the cardiologist,  and discussed lab and imaging findings as well as pertinent plan - they recommend: admission   Problem List / ED Course / Critical interventions / Medication management  Chest pain, unspecified  Vitals signs significant for elevated blood pressure 141/89.  Patient reports blood pressure was up in the 160s previously today. Otherwise within normal range and stable throughout visit. Laboratory/imaging studies significant for: See above On physical exam, patient is afebrile, BP elevated at 141/89. He appears in no acute distress. No diaphoresis, nausea, vomiting. Unsure about etiology of his pain. His 1st troponin elevated at 19 however seemed to be trending down from his troponin about a week ago of 156. Awaiting second troponin. We discussed case with cardiology about his symptoms and labs. At this point we can not rule out completely cardiac etiology of the pain given recurrent chest pressure close to his current MI. We will admit him in the hospital for monitoring and further evaluation. I have reviewed the patients home medicines and have made adjustments as needed    Social Determinants of Health:    Test / Admission - Considered: Cardiology recommends admission        Final Clinical Impression(s) / ED Diagnoses Final diagnoses:  Nonspecific chest pain  Elevated troponin I level    Rx / DC Orders ED Discharge Orders     None         Rex Kras, [place] 01/10/22 1535    Ripley Fraise, MD 01/12/22 [phone]

## 2022-01-09 NOTE — ED Triage Notes (Signed)
Patient here with chest pain, states that he is also having some pins and needles feelings in his abdomen, chest and face.  Patient took one nitro en route to ED.  Patient had VFib arrest earlier in the month.  No nausea, no vomiting.

## 2022-01-09 NOTE — ED Notes (Signed)
Pt ambulated to restroom without assistance at this time ?

## 2022-01-09 NOTE — Telephone Encounter (Signed)
Paged about patient. He had a cardiac arrest and Cx STEMI earlier this month. Tells me he was in class and felt pins and needles go across his chest and they have not gone away. He has some chest pressure as well. He is compliant with his DAPT. His BP is 160s. I recommended he come to emergency department to be evaluated. He agreed to plan.

## 2022-01-10 ENCOUNTER — Telehealth (HOSPITAL_COMMUNITY): Payer: Self-pay

## 2022-01-10 DIAGNOSIS — F419 Anxiety disorder, unspecified: Secondary | ICD-10-CM

## 2022-01-10 DIAGNOSIS — Z8674 Personal history of sudden cardiac arrest: Secondary | ICD-10-CM

## 2022-01-10 DIAGNOSIS — R079 Chest pain, unspecified: Secondary | ICD-10-CM | POA: Diagnosis not present

## 2022-01-10 DIAGNOSIS — F418 Other specified anxiety disorders: Secondary | ICD-10-CM

## 2022-01-10 DIAGNOSIS — I2129 ST elevation (STEMI) myocardial infarction involving other sites: Secondary | ICD-10-CM

## 2022-01-10 DIAGNOSIS — R778 Other specified abnormalities of plasma proteins: Secondary | ICD-10-CM | POA: Diagnosis not present

## 2022-01-10 DIAGNOSIS — I25118 Atherosclerotic heart disease of native coronary artery with other forms of angina pectoris: Secondary | ICD-10-CM

## 2022-01-10 DIAGNOSIS — Z955 Presence of coronary angioplasty implant and graft: Secondary | ICD-10-CM

## 2022-01-10 DIAGNOSIS — R072 Precordial pain: Secondary | ICD-10-CM

## 2022-01-10 LAB — TROPONIN I (HIGH SENSITIVITY)
Troponin I (High Sensitivity): 21 ng/L — ABNORMAL HIGH (ref <18)
Troponin I (High Sensitivity): 19 ng/L — ABNORMAL HIGH (ref <18)

## 2022-01-10 LAB — GLUCOSE, CAPILLARY: Glucose-Capillary: 123 mg/dL — ABNORMAL HIGH (ref 70–99)

## 2022-01-10 MED ORDER — METOPROLOL SUCCINATE ER 50 MG PO TB24
50.0000 mg | ORAL_TABLET | Freq: Every day | ORAL | Status: DC
  Administered 2022-01-11: 50 mg via ORAL
  Filled 2022-01-10: qty 1

## 2022-01-10 MED ORDER — NITROGLYCERIN 0.4 MG SL SUBL
0.4000 mg | SUBLINGUAL_TABLET | SUBLINGUAL | Status: DC | PRN
  Administered 2022-01-10: 0.4 mg via SUBLINGUAL
  Filled 2022-01-10: qty 1

## 2022-01-10 MED ORDER — INSULIN ASPART 100 UNIT/ML IJ SOLN: 0.0000 [IU] | Freq: Every day | INTRAMUSCULAR | Status: DC

## 2022-01-10 MED ORDER — ATORVASTATIN CALCIUM 80 MG PO TABS
80.0000 mg | ORAL_TABLET | Freq: Every day | ORAL | Status: DC
  Administered 2022-01-10 – 2022-01-11 (×2): 80 mg via ORAL
  Filled 2022-01-10: qty 1
  Filled 2022-01-10: qty 2

## 2022-01-10 MED ORDER — TICAGRELOR 90 MG PO TABS
90.0000 mg | ORAL_TABLET | Freq: Two times a day (BID) | ORAL | Status: DC
  Administered 2022-01-10 – 2022-01-11 (×3): 90 mg via ORAL
  Filled 2022-01-10 (×4): qty 1

## 2022-01-10 MED ORDER — ASPIRIN 81 MG PO CHEW
81.0000 mg | CHEWABLE_TABLET | Freq: Every day | ORAL | Status: DC
  Administered 2022-01-10 – 2022-01-11 (×2): 81 mg via ORAL
  Filled 2022-01-10 (×2): qty 1

## 2022-01-10 MED ORDER — LEVOTHYROXINE SODIUM 88 MCG PO TABS
88.0000 ug | ORAL_TABLET | Freq: Every day | ORAL | Status: DC
  Administered 2022-01-10 – 2022-01-11 (×2): 88 ug via ORAL
  Filled 2022-01-10 (×2): qty 1

## 2022-01-10 MED ORDER — ONDANSETRON HCL 4 MG/2ML IJ SOLN: 4.0000 mg | Freq: Four times a day (QID) | INTRAMUSCULAR | Status: DC | PRN

## 2022-01-10 MED ORDER — INSULIN ASPART 100 UNIT/ML IJ SOLN: 0.0000 [IU] | Freq: Three times a day (TID) | INTRAMUSCULAR | Status: DC

## 2022-01-10 MED ORDER — METOPROLOL SUCCINATE ER 25 MG PO TB24: 25.0000 mg | ORAL_TABLET | Freq: Once | ORAL | Status: DC

## 2022-01-10 MED ORDER — METOPROLOL SUCCINATE ER 25 MG PO TB24
25.0000 mg | ORAL_TABLET | Freq: Every day | ORAL | Status: DC
  Administered 2022-01-10: 25 mg via ORAL
  Filled 2022-01-10: qty 1

## 2022-01-10 MED ORDER — ACETAMINOPHEN 325 MG PO TABS
650.0000 mg | ORAL_TABLET | ORAL | Status: DC | PRN
  Filled 2022-01-10: qty 2

## 2022-01-10 NOTE — ED Notes (Signed)
Provider at bedside

## 2022-01-10 NOTE — Consult Note (Addendum)
Brief Psychiatry Consult Note  Called by cardiology PA with consult for "PTSD" in pt with recent hear attack. No reported suicidal ideation or red flag psychiatric signs.  Informed him of correct consult order, and that this otherwise medically stable pt would not be seen until late today or possibly tomorrow due to service line volume. Notably, this is pt's second presentation to [facility] ED since his VF arrest and lateral STEMI earlier this month.    - plan to see later today or tomorrow - may ask TTS provider to see  Joycelyn Schmid A Sameria Morss

## 2022-01-10 NOTE — ED Notes (Signed)
Per pt he is not a diabetic and is concerned about the sliding scale insulin. Retail banker to page admit provider

## 2022-01-10 NOTE — H&P (Addendum)
Cardiology Admission History and Physical   Patient ID: Todd [name] MRN: [id number]; DOB: 08/20/1975   Admission date: 01/09/2022  PCP:  Vivien Presto, MD   [facility] Providers Cardiologist:  Verne Carrow, MD      Chief Complaint:  Chest pain  Patient Profile:   Todd [name] is a 45 y.o. male with CAD with lateral STEMI 12/21/21 c/b by VF arrest, hypothyroidism who is being seen 01/10/2022 for the evaluation of chest pain.  History of Present Illness:   Todd [name] states he was at school when he developed the onset of chest pain that was sharp and felt like pins and needles. The pain persisted and he also noted a substernal chest tightness epigastric pain. He noted his BP to be 160s systolic. He called the triage nurse for directions and I spoke with him. I recommended he come to the ED to be evaluated. He does have a hx of shingles but of the vaccine and hasn't had any since. He normally gets eruptions on his L back side.   He recently had a VF arrest and lateral STEMI 9/7. He had PCI to Cx with 2 DES. He had angioplasty to ostium of OM2. Remainder of angiogram has mild nonobstructive disease. His post cath ECHO showed normal EF, normal RV, no sig valve stenosis or regurg. He was asked to stop testosterone and was put on DAPT with ASA and brillinta. He is also taking lipitor and metoprolol. He states he has been compliant with all his meds. After his STEMI he came back to the ER with chest pain and was admitted. Given workup unrevealing, and patients anxiety, pain attributed to anxiety (see discharge summary for more details).   He is again HDS. ECG NSR, non specific ST changes. Labs show largely [employer]. Trops 19->pending. CXR no acute process.    Past Medical History:  Diagnosis Date   Hyperlipidemia    NSVT (nonsustained ventricular tachycardia) (HCC) 12/23/2021   S/P angioplasty with stent s/p LCX PCI and OM2 POBA 12/21/21  12/23/2021   Thyroid  disease     Past Surgical History:  Procedure Laterality Date   CORONARY/GRAFT ACUTE MI REVASCULARIZATION N/A 12/21/2021   Procedure: Coronary/Graft Acute MI Revascularization;  Surgeon: Kathleene Hazel, MD;  Location: [facility]VASIVE [facility];  Service: Cardiovascular;  Laterality: N/A;   EYE SURGERY     LEFT HEART CATH AND CORONARY ANGIOGRAPHY N/A 12/21/2021   Procedure: LEFT HEART CATH AND CORONARY ANGIOGRAPHY;  Surgeon: Kathleene Hazel, MD;  Location: [facility] INVASIVE CV LAB;  Service: Cardiovascular;  Laterality: N/A;     Medications Prior to Admission: Prior to Admission medications   Medication Sig Start Date End Date Taking? Authorizing Provider  acetaminophen (TYLENOL) 325 MG tablet Take 2 tablets (650 mg total) by mouth every 4 (four) hours as needed for headache or mild pain. 12/23/21   Leone Brand, NP  aspirin 81 MG chewable tablet Chew 1 tablet (81 mg total) by mouth daily. 12/24/21   Leone Brand, NP  atorvastatin (LIPITOR) 80 MG tablet Take 1 tablet (80 mg total) by mouth daily. Patient taking differently: Take 80 mg by mouth at bedtime. 12/24/21   Leone Brand, NP  B Complex Vitamins (B-COMPLEX/B-12 PO) Take 1 tablet by mouth daily.    [provider]  Cholecalciferol (D3 5000 PO) Take 1 tablet by mouth daily.    [provider]  levothyroxine (SYNTHROID) 112 MCG tablet Take 112 mcg by  mouth daily before breakfast.    [provider]  MAGNESIUM PO Take 1 tablet by mouth daily.    [provider]  metFORMIN (GLUCOPHAGE) 500 MG tablet Take 500 mg by mouth daily. 08/24/21   [provider]  metoprolol succinate (TOPROL-XL) 25 MG 24 hr tablet Take 1 tablet (25 mg total) by mouth daily. 12/30/21   Margie Billet, NP  Multiple Vitamin (MULTIVITAMIN) tablet Take 1 tablet by mouth daily.    [provider]  Niacinamide-Zn-Cu-Methfo-Se-Cr (NICOTINAMIDE PO) Take 1 capsule by mouth daily.    [provider]  nitroGLYCERIN  (NITROSTAT) 0.4 MG SL tablet Place 1 tablet (0.4 mg total) under the tongue every 5 (five) minutes as needed for chest pain. 12/23/21   Isaiah Serge, NP  Omega-3 Fatty Acids (OMEGA-3 PLUS PO) Take 1 capsule by mouth daily.    [provider]  ticagrelor (BRILINTA) 90 MG TABS tablet Take 1 tablet (90 mg total) by mouth 2 (two) times daily. 12/23/21   Isaiah Serge, NP  zinc gluconate 50 MG tablet Take 50 mg by mouth daily.    [provider]     Allergies:   No Known Allergies  Social History:   Social History   Socioeconomic History   Marital status: Married    Spouse name: Not on file   Number of children: Not on file   Years of education: Not on file   Highest education level: Not on file  Occupational History   Not on file  Tobacco Use   Smoking status: Never   Smokeless tobacco: Not on file  Substance and Sexual Activity   Alcohol use: No   Drug use: No   Sexual activity: Not on file  Other Topics Concern   Not on file  Social History Narrative   Not on file   Social Determinants of Health   Financial Resource Strain: Not on file  Food Insecurity: No Food Insecurity (12/29/2021)   Hunger Vital Sign    Worried About Running Out of Food in the Last Year: Never true    Ran Out of Food in the Last Year: Never true  Transportation Needs: No Transportation Needs (12/29/2021)   PRAPARE - Hydrologist (Medical): No    Lack of Transportation (Non-Medical): No  Physical Activity: Not on file  Stress: Not on file  Social Connections: Not on file  Intimate Partner Violence: Not At Risk (12/29/2021)   Humiliation, Afraid, Rape, and Kick questionnaire    Fear of Current or Ex-Partner: No    Emotionally Abused: No    Physically Abused: No    Sexually Abused: No    Family History:   The patient's family history includes Heart attack (age of onset: 53) in his mother.    ROS:  Please see the history of present illness.  All other  ROS reviewed and negative.     Physical Exam/Data:   Vitals:   01/09/22 2220  BP: (!) 141/89  Pulse: 81  Resp: 18  Temp: 99.5 F (37.5 C)  TempSrc: Oral  SpO2: 94%   No intake or output data in the 24 hours ending 01/10/22 0136    12/30/2021    4:06 AM 12/29/2021    9:59 AM 12/27/2021    8:47 AM  Last 3 Weights  Weight (lbs) 247 lb 14.4 oz 245 lb 250 lb  Weight (kg) 112.447 kg 111.131 kg 113.399 kg     There is  no height or weight on file to calculate BMI.  General:  Well nourished, well developed, in no acute distress HEENT: normal Neck: no JVD Vascular: No carotid bruits; Distal pulses 2+ bilaterally   Cardiac:  normal S1, S2; RRR; no murmur  Lungs:  clear to auscultation bilaterally, no wheezing, rhonchi or rales  Abd: soft, nontender, no hepatomegaly  Ext: no edema Musculoskeletal:  No deformities, BUE and BLE strength normal and equal Skin: warm and dry  Neuro:  CNs 2-12 intact, no focal abnormalities noted Psych:  Normal affect    EKG:  The ECG that was done  was personally reviewed and demonstrates NSR with non specific ST changes   Relevant CV Studies: ECHO 12/2021 IMPRESSIONS     1. Hypokinesis of the inferolateral wall with overall low normal LV  function.   2. Left ventricular ejection fraction, by estimation, is 50 to 55%. The  left ventricle has low normal function. The left ventricle demonstrates  regional wall motion abnormalities (see scoring diagram/findings for  description). Left ventricular diastolic   parameters are consistent with Grade I diastolic dysfunction (impaired  relaxation).   3. Right ventricular systolic function is normal. The right ventricular  size is normal.   4. The mitral valve is normal in structure. Trivial mitral valve  regurgitation. No evidence of mitral stenosis.   5. The aortic valve is tricuspid. Aortic valve regurgitation is not  visualized. Aortic valve sclerosis is present, with no evidence of aortic  valve  stenosis.   6. The inferior vena cava is normal in size with greater than 50%  respiratory variability, suggesting right atrial pressure of 3 mmHg.   LHC 12/2021   Prox RCA lesion is 20% stenosed.   Prox Cx lesion is 100% stenosed.   Mid LAD lesion is 20% stenosed.   Ost Cx to Prox Cx lesion is 80% stenosed.   A drug-eluting stent was successfully placed using a SYNERGY XD 3.0X24.   A drug-eluting stent was successfully placed using a SYNERGY XD 3.50X28.   Post intervention, there is a 0% residual stenosis.   Post intervention, there is a 0% residual stenosis.   Acute lateral STEMI secondary to thrombotic occlusion of the mid Circumflex Successful PTCA/DES x 2 proximal/mid circumflex.  Successful balloon angioplasty only ostium of OM2 Mild plaque in the LAD and RCA LVEDP 24 mmHg   Recommendations: Will admit to 2H. Continue DAPT with ASA and Brilinta for one year. High intensity statin. Wean Levophed as tolerated. Echo tomorrow.   Laboratory Data:  High Sensitivity Troponin:   Recent Labs  Lab 12/21/21 0932 12/21/21 1120 12/29/21 1025 12/29/21 1330 01/09/22 2237  TROPONINIHS 9 27* 160* 156* 19*      Chemistry Recent Labs  Lab 01/09/22 2237  NA 138  K 3.8  CL 102  CO2 23  GLUCOSE 112*  BUN 16  CREATININE 1.08  CALCIUM 9.2  GFRNONAA >60  ANIONGAP 13    Recent Labs  Lab 01/09/22 2237  PROT 7.0  ALBUMIN 4.2  AST 29  ALT 32  ALKPHOS 52  BILITOT 0.9   Lipids No results for input(s): "CHOL", "TRIG", "HDL", "LABVLDL", "LDLCALC", "CHOLHDL" in the last 168 hours. Hematology Recent Labs  Lab 01/09/22 2237  WBC 10.9*  RBC 5.14  HGB 16.8  HCT 49.1  MCV 95.5  MCH 32.7  MCHC 34.2  RDW 12.0  PLT 202   Thyroid No results for input(s): "TSH", "FREET4" in the last 168 hours. BNPNo results for input(s): "  BNP", "PROBNP" in the last 168 hours.  DDimer No results for input(s): "DDIMER" in the last 168 hours.   Radiology/Studies:  DG Chest 2 View  Result Date:  01/09/2022 CLINICAL DATA:  Chest pain EXAM: CHEST - 2 VIEW COMPARISON:  12/29/2021 FINDINGS: The heart size and mediastinal contours are within normal limits. Both lungs are clear. The visualized skeletal structures are unremarkable. IMPRESSION: No active cardiopulmonary disease. Electronically Signed   By: Jasmine Pang M.D.   On: 01/09/2022 23:23     Assessment and Plan:   Todd [name] is a 46 y.o. male with CAD with lateral STEMI 12/21/21 c/b by VF arrest, type 2 diabetes, hypothyroidism who is being seen 01/10/2022 for the evaluation of chest pain.  Atypical chest pain, though patient is high risk Hx of STEMI c/b VF arrest  Hypothyroidism  - Tele  - Continue DAPT - Trend trops, suspect 2nd will be flat - Consider ischemic eval  - Keep NPO - Continue metoprolol and statin  - No indication for heparin right now - Home levothyroxine     Risk Assessment/Risk Scores:    TIMI Risk Score for Unstable Angina or Non-ST Elevation MI:   The patient's TIMI risk score is 3, which indicates a 13% risk of all cause mortality, new or recurrent myocardial infarction or need for urgent revascularization in the next 14 days.    Severity of Illness: The appropriate patient status for this patient is OBSERVATION. Observation status is judged to be reasonable and necessary in order to provide the required intensity of service to ensure the patient's safety. The patient's presenting symptoms, physical exam findings, and initial radiographic and laboratory data in the context of their medical condition is felt to place them at decreased risk for further clinical deterioration. Furthermore, it is anticipated that the patient will be medically stable for discharge from the hospital within 2 midnights of admission.    For questions or updates, please contact [facility] Please consult [email or web address] for contact info under     Signed, Adline Mango, MD  01/10/2022 1:36 AM

## 2022-01-10 NOTE — Progress Notes (Addendum)
Rounding Note    Patient Name: Todd [name] Date of Encounter: 01/10/2022  [facility] Cardiologist: Lauree Chandler, MD   Subjective   Chest pain free but tearful and anxious.   Inpatient Medications    Scheduled Meds:  aspirin  81 mg Oral Daily   atorvastatin  80 mg Oral Daily   levothyroxine  88 mcg Oral QAC breakfast   metoprolol succinate  25 mg Oral Once   [START ON 01/11/2022] metoprolol succinate  50 mg Oral Daily   ticagrelor  90 mg Oral BID   Continuous Infusions:  PRN Meds: acetaminophen, nitroGLYCERIN, ondansetron (ZOFRAN) IV   Vital Signs    Vitals:   01/10/22 0530 01/10/22 0532 01/10/22 0630 01/10/22 0815  BP: (!) 147/96  122/88 131/83  Pulse: 93  73 73  Resp: 14  12 18   Temp:  98.7 F (37.1 C)  98.6 F (37 C)  TempSrc:  Oral  Oral  SpO2: 96%  97% 95%  Weight:      Height:       No intake or output data in the 24 hours ending 01/10/22 1029    01/10/2022    2:21 AM 12/30/2021    4:06 AM 12/29/2021    9:59 AM  Last 3 Weights  Weight (lbs) 247 lb 14.4 oz 247 lb 14.4 oz 245 lb  Weight (kg) 112.447 kg 112.447 kg 111.131 kg      Telemetry    SR - Personally Reviewed  ECG    N/A  Physical Exam   GEN: No acute distress.   Neck: No JVD Cardiac: RRR, no murmurs, rubs, or gallops.  Respiratory: Clear to auscultation bilaterally. GI: Soft, nontender, non-distended  MS: No edema; No deformity. Neuro:  Nonfocal  Psych: Normal affect   Labs    High Sensitivity Troponin:   Recent Labs  Lab 12/29/21 1025 12/29/21 1330 01/09/22 2237 01/10/22 0340 01/10/22 0631  TROPONINIHS 160* 156* 19* 21* 19*     Chemistry Recent Labs  Lab 01/09/22 2237  NA 138  K 3.8  CL 102  CO2 23  GLUCOSE 112*  BUN 16  CREATININE 1.08  CALCIUM 9.2  PROT 7.0  ALBUMIN 4.2  AST 29  ALT 32  ALKPHOS 52  BILITOT 0.9  GFRNONAA >60  ANIONGAP 13    Lipids No results for input(s): "CHOL", "TRIG", "HDL", "LABVLDL", "LDLCALC", "CHOLHDL" in  the last 168 hours.  Hematology Recent Labs  Lab 01/09/22 2237  WBC 10.9*  RBC 5.14  HGB 16.8  HCT 49.1  MCV 95.5  MCH 32.7  MCHC 34.2  RDW 12.0  PLT 202    Radiology    DG Chest 2 View  Result Date: 01/09/2022 CLINICAL DATA:  Chest pain EXAM: CHEST - 2 VIEW COMPARISON:  12/29/2021 FINDINGS: The heart size and mediastinal contours are within normal limits. Both lungs are clear. The visualized skeletal structures are unremarkable. IMPRESSION: No active cardiopulmonary disease. Electronically Signed   By: Donavan Foil M.D.   On: 01/09/2022 23:23    Cardiac Studies   N/A  Patient Profile     46 y.o. male with CAD with lateral STEMI 12/21/21 c/b by VF arrest, s/p PCI to Cx with 2 DES and angioplasty to ostium OM2, HTN, HLD and hypothyroidism who is being seen 01/10/2022 for the evaluation of chest pain.  Assessment & Plan    Chest pain -atypical almost tactile seeming sensation that he describes as a tingling feeling that is exacerbated by the  movement of his shirt on his chest.  It does not necessarily sound as though this is consistent with a tachycardia or palpitations, but it is clearly associated with increased level of anxiety. - H/o LVF Arrest- ROSC Revealed Lateral STEMI 12/21/21  => s/p PCI to Cx with 2 DES and PTFCA OstOM2 - Admission overnight 9/15 for chest pain. Felt atypical, consistent with anxiety similar to previous admission with chest pain described as pins-and-needles across the chest. -Cletis is clearly anxious about his health and recurrent cardiac event. Describing events/episode consistent with anxiety/panic attack. => He is concerned about how he will ever get back to his regular life routine.  He is concerned about any sensation he feels in his chest. -Psychiatry consult placed with thoughts that he may potentially go home if he remains stable-> unfortunately, due to emergency in the cardiac catheterization lab, Dr. Ellyn Hack was not able to see the patient until  after hours -close to 630-7:00 pm => when I saw him, he was feeling better, but was still having some mild paresthesias.  Recommendation would be to monitor him overnight to see if there are any symptoms that would be associated with any arrhythmias on the monitor.  This would preclude the need for potentially considering outpatient event monitor. - Thankfully patient's troponin is low flat ( not consistent with ACS). EKG without acute ischemic changes.  - Will let him eat  - Increase Toprol XL to 50mg  qd (antianginal and helps with intermittent elevated BP)  2. HTN - Reporting elevated blood pressure while anxious - Given above will increase BB  3. Mild LV dysfunction - Echo during MI in 9/8 showed LVEF of 50-55% with hypokinesis of the inferolateral wall - No heart failure symptoms - Increase BB as above - Could repeat limited echo   For questions or updates, please contact [facility] Please consult [email or web address] for contact info under        Signed, Leanor Kail, PA  01/10/2022, 10:29 AM     ATTENDING ATTESTATION  I have seen, examined and evaluated the patient this evening after initial evaluation by Mr. Curly [place], [place] (admitted early this AM by On-Call Fellow).  After reviewing all the available data and chart, we discussed the patients laboratory, study & physical findings as well as symptoms in detail.  I agree with his findings, examination as well as impression recommendations as per our discussion.    Attending adjustments noted in italics.   -We have consulted psychiatry, who have graciously seen the patient and referred for outpatient evaluation.  The reason for consultation was asking for assistance in clarifying likely diagnosis of what sounds like PTSD or generalized anxiety with anxiety attacks associated with his recent MI.  I spoke with him for just over an hour about his concerns about his anxiety.  I do agree that he would benefit from outpatient  counseling, but I confirmed to him that it is normal for him to feel anxious and concerned and stressed out about his recent brush with mortality.  This is a normal reaction that can lead to more abnormal and atypical sounding symptoms.  Stressed that it was important for him to acknowledge that these are normal sensations and that he will gradually become more secure with his symptoms.  I would defer any suggestion of medical therapy to psychiatry, but was simply wanting their assistance.  I think his best benefit will be from cardiac rehab and we will therefore asked them to see him in the  morning.  He does need close follow-up in the outpatient setting.  He is ruled out for MI which is reassuring.  Plan will be to evaluate for any arrhythmias which could explain the paresthesias she is feeling in his chest.  I would like for him to then potentially be reevaluated by cardiac rehab in the morning and potentially we can make a formal referral to [facility] as he is now beyond 3 weeks from his MI.  Otherwise, we can make it earlier than originally scheduled visit in the outpatient setting to establish for complete referral to our phase 2 cardiac rehab. I think it would be a reasonable idea for him to exercise and a symptom limited GXT prior to starting cardiac rehab.  This would be while on beta-blocker, and serve as a baseline. Prior to his MI, he was very active doing jujitsu and other lifting exercises etc.  If he has any symptoms of dyspnea associated any potential symptoms overnight, would have low threshold to consider reassessing echocardiogram, but otherwise we can probably hold off for now.     Between PA (35 minutes), and MD (70 minutes) -> total of 135 minutes spent with the patient in direct consultation.  We have also discussed the case with psychiatry.   Leonie Man, MD, MS Glenetta Hew, M.D., M.S. Interventional Cardiologist  [place]  Pager # [phone] Phone #  [phone] [address] [place], [place] [address]

## 2022-01-10 NOTE — Telephone Encounter (Signed)
Pt returned CR phone call and stated he is interested. Adv pt he will need to complete his f/u ED appt on 11/6 1st.

## 2022-01-10 NOTE — Progress Notes (Signed)
Patient arrived from [place] given CCMD notified. Care continues

## 2022-01-10 NOTE — ED Notes (Signed)
Pt is now c/o feeling the pins and needles in bilat hands at this time, back pain in between shoulder blades.

## 2022-01-10 NOTE — ED Notes (Signed)
Paged admit provider and received call back from cardio. Made aware of current trop level and pt requesting something to drink. Advised pt could have small sips of water and some ice chips. But we really need to wait till repeat trop in 3 hr

## 2022-01-10 NOTE — ED Notes (Signed)
Spoke with admit cardio about the pt c/o back pain and the pins and needles in his hands. Asked provider if he would like for me to try nitro and was advised to do so.

## 2022-01-10 NOTE — Consult Note (Addendum)
[facility] New Face-to-Face Psychiatric Evaluation   Service Date: January 10, 2022 LOS:  LOS: 0 days    Assessment  Todd [name] is a 46 y.o. male admitted medically for 01/09/2022 10:13 PM for chest pain. He carries the psychiatric diagnoses of no formal diagnosis and has a past medical history of heart attack with vfib arrest on his 41th birthday earlier this month.Psychiatry was consulted for anxiety and PTSD by PA Bhagat.    His current presentation of multiple episodes of chest pain and associated symptoms in setting of heightened anxiety after a serious heart attack is most consistent with acute stress disorder vs adjustment disorder with anxiety. He does not meet criteria for PTSD - less than one month since event.   Current outpatient psychotropic medications include nothing; he is not interested in any medications today.  On initial examination, patient was pleasant with good insight and willing to engage in followup with a therapist. He was quite pleasant; utilizes humor as main mature defense mechanism (and somatization as main immature mechanism). Please see plan below for detailed recommendations.   Diagnoses:  Active Hospital problems: Principal Problem:   Chest pain     Plan  ## Safety and Observation Level:  - Based on my clinical evaluation, I estimate the patient to be at low risk of self harm in the current setting - At this time, we recommend a routine level of observation. This decision is based on my review of the chart including patient's history and current presentation, interview of the patient, mental status examination, and consideration of suicide risk including evaluating suicidal ideation, plan, intent, suicidal or self-harm behaviors, risk factors, and protective factors. This judgment is based on our ability to directly address suicide risk, implement suicide prevention strategies and develop a safety plan while the patient is in the  clinical setting. Please contact our team if there is a concern that risk level has changed.   ## Medications:  -- none   ## Medical Decision Making Capacity:  Not assessed  ## Further Work-up:  -- none currently  Please discuss pt's activity limitations with focus on enabling pt to do as much physical activity as is safe.   -- most recent EKG on 9/27 had QtC of 447  ## Disposition:  -- per primary -- Have messaged Conception Chancy, PsyD, who will have his scheduler call for outpt f/u  Thank you for this consult request. Recommendations have been communicated to the primary team.  We will sign off in anticipation of discharge tomorrow at this time.   [place] A Bruce Mayers   New history  Relevant Aspects of Hospital Course:  Admitted on 01/09/2022 for chest pain; found to have no organic cause. Notably suffered MI with vfib arrest on 9/7 and had an additional presentation to the hospital on 9/15.  Patient Report:  Pt seen in late afternoon. He was not aware that psychiatry was consulted, but open to talking. He knows that cardiology feels most of his symptoms are due to anxiety. He describes one episode in which he had some cardiac symptoms, went to mediate for an hour, and felt much better, but now twice he has had chest pain (with anxiety) unresponsive to his normal coping mechanisms for which he has come to the ED. Validated his fears and discussed (given history) that coming to the ED with chest pain is going to be recommended. He discusses at length his initial ED visits - went to urgent care with no  EKG changes but was sent to [facility] ED due to nature of symptoms, was sent to [facility] ED with no EKG changes or elevation in troponin and it sounds like he was being dischaged or reassured when he went into arrest (at least from his perspective but roughly c/w medical records). He had a vision of himself and his wife at a restaurant in [place] and saw his deceased mother who told  him to "wake up"; when he woke up saw his wife crying in a corner and was receiving chest compressions. Reassured him that these visions are normal; pt interpreting this as glimpse of afterlife Personal assistant).   Since that time, he has been unable to exercise beyond walking (his f/u appt with cardiology is in November) and has had gradual increase in his level of anxiety. He normally lifts daily and does an hour or two of BJJ every day. 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 He received 3 sessions of counselling for each of these to good effect. His father is an alcoholic, and hasn't been involved in his life since he was 24; calls his stepdad "dad". His mother died of cancer several years ago.  ROS:  Chronic sleep disturbance (takes benadryl a few nights a month) no anhedonia, no guilt, no changes in energy (if anything, too much - can't exercise), no changes in concentration or appetite. Feels like he can't engage in a lot of things he enjoys because of his activity restrictions.   No hallucinations (outside of near death experience) or other signs of psychosis  Sx c/w panic with somatic manifestation as above  No symptoms of mania on brief screen.   Multiple lifetime traumas as above - some hypervigilance (frequently checks BP, although this may be within medical advice) and flashbacks to "when I died".    Psychiatric History:  Information collected from pt, medical record Pt with prior episodes of counselling for what sounds like acute stress disorder, no hx psychotropics  Family psych history: mother with SI during cancer  treatment, 1 hospitalization, lived another 52 years. 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  Learning [place] - >1 year streak on duolingo maintained through his ICU stay.  Finished college  Several guns in home, secured, discussed general psychiatry recommendation against   Tobacco use: no Alcohol use: no (father alcoholic, strong aversion) Drug use: no  Family History:  The patient's family history includes Heart attack (age of onset: 87) in his mother.  Medical History: Past Medical History:  Diagnosis Date   Hyperlipidemia    NSVT (nonsustained ventricular tachycardia) ([place]) 12/23/2021   S/P angioplasty with stent s/p LCX PCI and OM2 POBA 12/21/21  12/23/2021   Thyroid disease     Surgical History: Past Surgical History:  Procedure Laterality Date   CORONARY/GRAFT ACUTE MI REVASCULARIZATION N/A 12/21/2021   Procedure: Coronary/Graft Acute MI Revascularization;  Surgeon: Burnell Blanks, MD;  Location: [facility];  Service: Cardiovascular;  Laterality: N/A;   EYE SURGERY     LEFT HEART CATH AND CORONARY ANGIOGRAPHY N/A 12/21/2021   Procedure: LEFT HEART CATH AND CORONARY ANGIOGRAPHY;  Surgeon: Burnell Blanks, MD;  Location: [facility];  Service: Cardiovascular;  Laterality: N/A;    Medications:   Current Facility-Administered Medications:    acetaminophen (TYLENOL) tablet 650 mg, 650 mg, Oral, Q4H PRN, Mayl, Pearletha Forge, MD   aspirin chewable tablet 81 mg, 81 mg, Oral, Daily, Mayl, Pearletha Forge, MD, 81 mg at 01/10/22 1001   atorvastatin (LIPITOR) tablet 80 mg, 80 mg, Oral, Daily, Mayl, Pearletha Forge, MD, 80 mg at 01/10/22 1001   levothyroxine (SYNTHROID) tablet 88 mcg, 88 mcg, Oral, QAC breakfast, Mayl, Pearletha Forge, MD, 88 mcg at 01/10/22 0549   metoprolol succinate (TOPROL-XL) 24 hr tablet 25 mg, 25 mg, Oral, Once, [place],  Beaver, PA   [START ON 01/11/2022] metoprolol succinate (TOPROL-XL) 24 hr tablet 50 mg, 50 mg, Oral, Daily, Bhagat, Bhavinkumar, PA   nitroGLYCERIN (NITROSTAT) SL tablet 0.4 mg, 0.4 mg, Sublingual, Q5 Min x 3 PRN, Mayl, Pearletha Forge, MD, 0.4 mg at 01/10/22 0527   ondansetron (ZOFRAN) injection 4 mg, 4 mg, Intravenous, Q6H PRN, Mayl, Pearletha Forge, MD   ticagrelor (BRILINTA) tablet 90 mg, 90 mg, Oral, BID, Mayl, Pearletha Forge, MD, 90 mg at 01/10/22 1001  Allergies: No Known Allergies     Objective  Vital signs:  Temp:  [97.8 F (36.6 C)-99.5 F (37.5 C)] 97.8 F (36.6 C) (09/27 1536) Pulse Rate:  [63-93] 69 (09/27 1536) Resp:  [12-20] 20 (09/27 1536) BP: (121-149)/(73-96) 121/73 (09/27 1536) SpO2:  [92 %-99 %] 92 % (09/27 1536) Weight:  [112.4 kg] 112.4 kg (09/27 0221)  Psychiatric Specialty Exam:  Presentation  General Appearance: Appropriate for Environment; Casual Eye Contact:Good Speech:Clear and Coherent Speech Volume:Normal Handedness:No data recorded  Mood and Affect  Mood:Anxious Affect:Congruent  Thought Process  Thought Processes:Coherent; Goal Directed; Linear Descriptions of Associations:Intact  Orientation:Full (Time, Place and Person)  Thought Content:-- (devoid of delusions or paranoia)  History of Schizophrenia/Schizoaffective disorder:No data recorded Duration of Psychotic Symptoms:No data recorded Hallucinations:Hallucinations: None  Ideas of Reference:None  Suicidal Thoughts:Suicidal Thoughts: No  Homicidal Thoughts:Homicidal Thoughts: No   Sensorium  Memory:Immediate Good; Recent Good Judgment:Fair Insight:Good  Executive Functions  Concentration:Good Attention Span:Good [place] of Knowledge:Good Language:Good  Psychomotor Activity  Psychomotor Activity:Psychomotor Activity: Normal  Assets  Assets:Communication Skills; Desire for Improvement; Financial Resources/Insurance; Housing; Intimacy; Leisure Time; Resilience;  Social Support; Talents/Skills; Transportation; Vocational/Educational  Sleep  Sleep:No data recorded   Physical Exam: Physical Exam HENT:     Head: Normocephalic.  Eyes:     Conjunctiva/sclera: Conjunctivae normal.  Pulmonary:     Effort: Pulmonary effort is normal.  Neurological:     Mental Status: He is alert.     Blood pressure 121/73, pulse 69, temperature 97.8 F (36.6 C), temperature source Oral, resp. rate 20, height 6' (1.829 m), weight 112.4 kg, SpO2 92 %. Body mass index is 33.62 kg/m.

## 2022-01-11 DIAGNOSIS — R072 Precordial pain: Secondary | ICD-10-CM | POA: Diagnosis not present

## 2022-01-11 DIAGNOSIS — I4729 Other ventricular tachycardia: Secondary | ICD-10-CM

## 2022-01-11 DIAGNOSIS — F410 Panic disorder [episodic paroxysmal anxiety] without agoraphobia: Secondary | ICD-10-CM

## 2022-01-11 DIAGNOSIS — Z95820 Peripheral vascular angioplasty status with implants and grafts: Secondary | ICD-10-CM

## 2022-01-11 DIAGNOSIS — E785 Hyperlipidemia, unspecified: Secondary | ICD-10-CM

## 2022-01-11 DIAGNOSIS — I25119 Atherosclerotic heart disease of native coronary artery with unspecified angina pectoris: Secondary | ICD-10-CM

## 2022-01-11 DIAGNOSIS — I2510 Atherosclerotic heart disease of native coronary artery without angina pectoris: Secondary | ICD-10-CM | POA: Diagnosis present

## 2022-01-11 DIAGNOSIS — F418 Other specified anxiety disorders: Secondary | ICD-10-CM | POA: Diagnosis not present

## 2022-01-11 DIAGNOSIS — Z8674 Personal history of sudden cardiac arrest: Secondary | ICD-10-CM | POA: Diagnosis not present

## 2022-01-11 MED ORDER — METOPROLOL SUCCINATE ER 50 MG PO TB24: 50.0000 mg | ORAL_TABLET | Freq: Every day | ORAL | 6 refills | Status: DC

## 2022-01-11 NOTE — Discharge Summary (Addendum)
Discharge Summary    Patient ID: Todd [name] MRN: [id number]; DOB: 01/16/1976  Admit date: 01/09/2022 Discharge date: 01/11/2022  PCP:  Curly Rim, MD   [place] [facility] Cardiologist:  Lauree Chandler, MD      Discharge Diagnoses    Principal Problem:   Precordial pain Active Problems:   ST elevation myocardial infarction (STEMI) of lateral wall, subsequent episode of care [facility])   S/P angioplasty with stent s/p LCX PCI and OM2 POBA 12/21/21    Coronary artery disease involving native coronary artery of native heart   History of cardiac arrest   NSVT (nonsustained ventricular tachycardia) (HCC)   Hyperlipidemia with target LDL less than 70   Anxiety about dying/health   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 male with hx of CAD with lateral STEMI 12/21/21 c/b by VF arrest, s/p PCI to Cx with 2 DES and angioplasty to ostium OM2, HTN, HLD and hypothyroidism who is being seen 01/10/2022 for the evaluation of chest pain.  H/o LVF Arrest- ROSC Revealed Lateral STEMI 12/21/21  => s/p PCI to Cx with 2 DES and PTFCA OstOM2  Admission overnight 9/15 for chest pain. Felt atypical, consistent with anxiety similar to previous admission with chest pain described as pins-and-needles across the chest.  He was at school when he developed the onset of chest pain that was sharp and felt like pins and needles. The pain persisted and he also noted a substernal chest tightness epigastric pain. He noted his BP to be [id number] systolic. He called the triage nurse for directions and I spoke with him. I recommended he come to the ED to be evaluated. He does have a hx of shingles but of the vaccine and hasn't had any since. He normally gets eruptions on his L back side.   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  It does not necessarily sound as though this is consistent with a tachycardia or palpitations, but it is clearly associated with increased level of anxiety. Thankfully patient's troponin is low flat ( not consistent with ACS). EKG without acute ischemic changes.  - He is clearly anxious about his health and recurrent cardiac event. Describing events/episode consistent with anxiety/panic attack. => He is concerned about how he will ever get back to his regular life routine. He is concerned about any sensation he feels in his chest. Psychiatry consult. No SI. Plan to follow up with Conception Chancy, PsyD as outpatient.  - Increase Toprol XL to 50mg  qd (antianginal and helps with intermittent elevated BP) - Symptoms resolved. Felt much better.  He felt very reassured after long conversation yesterday.  This empowered him to acknowledge him being upset about having  had cardiac arrest and major MI.  Explained the concept of stress and anxiety driven symptoms.  Most needs reassurance I do think he would benefit from seeing Dr. Michail Sermon.   2. HTN - Reporting elevated blood pressure while anxious - Given above increased BB. No recurrent symptoms.    3. Mild LV dysfunction - Echo during MI in 9/8 showed LVEF of 50-55% with hypokinesis of the inferolateral wall - No heart failure symptoms - Increase BB as above    Did the  patient have an acute coronary syndrome (MI, NSTEMI, STEMI, etc) this admission?:  No                               Did the patient have a percutaneous coronary intervention (stent / angioplasty)?:  No.          _____________  Discharge Vitals Blood pressure 116/74, pulse 63, temperature 98.9 F (37.2 C), temperature source Oral, resp. rate 17, height 6' (1.829 m), weight 112.4 kg, SpO2 97 %.  Filed Weights   01/10/22 0221  Weight: 112.4 kg    Labs & Radiologic Studies    CBC Recent Labs    01/09/22 2237  WBC 10.9*  NEUTROABS 8.7*  HGB 16.8  HCT 49.1  MCV 95.5  PLT 563   Basic Metabolic Panel Recent Labs    01/09/22 2237  NA 138  K 3.8  CL 102  CO2 23  GLUCOSE 112*  BUN 16  CREATININE 1.08  CALCIUM 9.2   Liver Function Tests Recent Labs    01/09/22 2237  AST 29  ALT 32  ALKPHOS 52  BILITOT 0.9  PROT 7.0  ALBUMIN 4.2   Recent Labs    01/09/22 2237  LIPASE 35   High Sensitivity Troponin:   Recent Labs  Lab 12/29/21 1025 12/29/21 1330 01/09/22 2237 01/10/22 0340 01/10/22 0631  TROPONINIHS 160* 156* 19* 21* 19*    BNP Invalid input(s): "POCBNP" D-Dimer No results for input(s): "DDIMER" in the last 72 hours. Hemoglobin A1C No results for input(s): "HGBA1C" in the last 72 hours. Fasting Lipid Panel No results for input(s): "CHOL", "HDL", "LDLCALC", "TRIG", "CHOLHDL", "LDLDIRECT" in the last 72 hours. Thyroid Function Tests No results for input(s): "TSH", "T4TOTAL", "T3FREE", "THYROIDAB" in the last 72 hours.  Invalid input(s): "FREET3" _____________  DG Chest 2 View  Result Date: 01/09/2022 CLINICAL DATA:  Chest pain EXAM: CHEST - 2 VIEW COMPARISON:  12/29/2021 FINDINGS: The heart size and mediastinal contours are within normal limits. Both lungs are clear. The visualized skeletal structures are unremarkable. IMPRESSION: No active cardiopulmonary disease. Electronically Signed   By: Donavan Foil M.D.   On: 01/09/2022 23:23   DG Chest 2  View  Result Date: 12/29/2021 CLINICAL DATA:  chest pain EXAM: CHEST - 2 VIEW COMPARISON:  Radiograph 12/21/2021 FINDINGS: The cardiomediastinal silhouette is within normal limits. There is no focal airspace consolidation. There is no pleural effusion. No pneumothorax. There is no acute osseous abnormality. IMPRESSION: No evidence of acute cardiopulmonary disease. Electronically Signed   By: Maurine Simmering M.D.   On: 12/29/2021 10:49   ECHOCARDIOGRAM COMPLETE  Result Date: 12/22/2021    ECHOCARDIOGRAM REPORT   Patient Name:   Carmen Vallecillo Date of Exam: 12/22/2021 Medical Rec #:  [id number]      Height:       72.0 in Accession #:    [id number]     Weight:  245.0 lb Date of Birth:  1976/01/18       BSA:          2.322 m Patient Age:    89 years       BP:           132/86 mmHg Patient Gender: M              HR:           62 bpm. 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                 Acute MI, Abnormal ECG, Arrythmias:Cardiac Arrest; Risk                 Factors:Dyslipidemia.  Sonographer:    Roseanna Rainbow RDCS Referring Phys: Burnell Blanks  Sonographer Comments: Technically difficult study due to poor echo windows. Image acquisition challenging due to patient body habitus. IMPRESSIONS  1. Hypokinesis of the inferolateral wall with overall low normal LV function.  2. Left ventricular ejection fraction, by estimation, is 50 to 55%. The left ventricle has low normal function. The left ventricle demonstrates regional wall motion abnormalities (see scoring diagram/findings for description). Left ventricular diastolic  parameters are consistent with Grade I diastolic dysfunction (impaired relaxation).  3. Right ventricular systolic function is normal. The right  ventricular size is normal.  4. The mitral valve is normal in structure. Trivial mitral valve regurgitation. No evidence of mitral stenosis.  5. The aortic valve is tricuspid. Aortic valve regurgitation is not visualized. Aortic valve sclerosis is present, with no evidence of aortic valve stenosis.  6. The inferior vena cava is normal in size with greater than 50% respiratory variability, suggesting right atrial pressure of 3 mmHg. Comparison(s): No prior Echocardiogram. FINDINGS  Left Ventricle: Left ventricular ejection fraction, by estimation, is 50 to 55%. The left ventricle has low normal function. The left ventricle demonstrates regional wall motion abnormalities. The left ventricular internal cavity size was normal in size. There is no left ventricular hypertrophy. Left ventricular diastolic parameters are consistent with Grade I diastolic dysfunction (impaired relaxation). Right Ventricle: The right ventricular size is normal. Right ventricular systolic function is normal. Left Atrium: Left atrial size was normal in size. Right Atrium: Right atrial size was normal in size. Pericardium: There is no evidence of pericardial effusion. Mitral Valve: The mitral valve is normal in structure. Mild mitral annular calcification. Trivial mitral valve regurgitation. No evidence of mitral valve stenosis. Tricuspid Valve: The tricuspid valve is normal in structure. Tricuspid valve regurgitation is trivial. No evidence of tricuspid stenosis. Aortic Valve: The aortic valve is tricuspid. Aortic valve regurgitation is not visualized. Aortic valve sclerosis is present, with no evidence of aortic valve stenosis. Pulmonic Valve: The pulmonic valve was normal in structure. Pulmonic valve regurgitation is not visualized. No evidence of pulmonic stenosis. Aorta: The aortic root is normal in size and structure. Venous: The inferior vena cava is normal in size with greater than 50% respiratory variability, suggesting right atrial  pressure of 3 mmHg. IAS/Shunts: No atrial level shunt detected by color flow Doppler. Additional Comments: Hypokinesis of the inferolateral wall with overall low normal LV  function.  LEFT VENTRICLE PLAX 2D LVIDd:         5.10 cm     Diastology LVIDs:         3.70 cm     LV e' medial:    6.31 cm/s LV PW:         1.00 cm     LV E/e' medial:  12.5 LV IVS:        1.00 cm     LV e' lateral:   7.18 cm/s LVOT diam:     2.30 cm     LV E/e' lateral: 11.0 LV SV:         85 LV SV Index:   37 LVOT Area:     4.15 cm                             3D Volume EF: LV Volumes (MOD)           3D EF:        49 % LV vol d, MOD A2C: 69.6 ml LV EDV:       134 ml LV vol d, MOD A4C: 93.4 ml LV ESV:       69 ml LV vol s, MOD A2C: 32.6 ml LV SV:        65 ml LV vol s, MOD A4C: 44.0 ml LV SV MOD A2C:     37.0 ml LV SV MOD A4C:     93.4 ml LV SV MOD BP:      43.4 ml RIGHT VENTRICLE            IVC RV S prime:     7.65 cm/s  IVC diam: 2.20 cm TAPSE (M-mode): 1.5 cm LEFT ATRIUM             Index        RIGHT ATRIUM          Index LA diam:        3.80 cm 1.64 cm/m   RA Area:     9.02 cm LA Vol (A2C):   27.2 ml 11.71 ml/m  RA Volume:   15.40 ml 6.63 ml/m LA Vol (A4C):   20.6 ml 8.87 ml/m LA Biplane Vol: 26.4 ml 11.37 ml/m  AORTIC VALVE LVOT Vmax:   119.00 cm/s LVOT Vmean:  77.550 cm/s LVOT VTI:    0.205 m  AORTA Ao Root diam: 3.50 cm Ao Asc diam:  3.50 cm MITRAL VALVE MV Area (PHT): 4.79 cm    SHUNTS MV Decel Time: 159 msec    Systemic VTI:  0.20 m MV E velocity: 78.90 cm/s  Systemic Diam: 2.30 cm MV A velocity: 84.65 cm/s MV E/A ratio:  0.93 Kirk Ruths MD Electronically signed by Kirk Ruths MD Signature Date/Time: 12/22/2021/3:05:07 PM    Final    CARDIAC CATHETERIZATION  Result Date: 12/21/2021   Prox RCA lesion is 20% stenosed.   Prox Cx lesion is 100% stenosed.   Mid LAD lesion is 20% stenosed.   Ost Cx to Prox Cx lesion is 80% stenosed.   A drug-eluting stent was successfully placed using a SYNERGY XD 3.0X24.   A drug-eluting  stent was successfully placed using a SYNERGY XD 3.50X28.   Post intervention, there is a 0% residual stenosis.   Post intervention, there is a 0% residual stenosis. Acute lateral STEMI secondary to thrombotic occlusion of the mid Circumflex Successful PTCA/DES x 2 proximal/mid circumflex.  Successful balloon angioplasty only ostium of OM2 Mild plaque in the LAD and RCA LVEDP 24 mmHg Recommendations: Will admit to [place]. Continue DAPT with ASA and Brilinta for one year. High intensity statin. Wean Levophed as tolerated. Echo tomorrow.   CT Angio Chest/Abd/Pel for Dissection W and/or Wo Contrast  Result Date: 12/21/2021 CLINICAL DATA:  Acute chest and abdominal pain. EXAM: CT ANGIOGRAPHY CHEST, ABDOMEN AND PELVIS TECHNIQUE: Non-contrast CT of the chest was initially obtained. Multidetector CT imaging through the chest, abdomen and pelvis was performed using the standard protocol during bolus administration of intravenous contrast. Multiplanar reconstructed images and MIPs were obtained and reviewed to evaluate the vascular anatomy. RADIATION DOSE REDUCTION: This exam was performed according to the departmental dose-optimization program which includes automated exposure control, adjustment of the mA and/or kV according to patient size and/or use of iterative reconstruction technique. CONTRAST:  172mL OMNIPAQUE IOHEXOL 350 MG/ML SOLN COMPARISON:  CT abdomen/pelvis 10/09/2020 FINDINGS: CTA CHEST FINDINGS Cardiovascular: The heart is normal in size. No pericardial effusion. The aorta is normal in caliber. No dissection. No atherosclerotic calcifications. The branch vessels are patent. No coronary artery calcifications. The pulmonary arteries are grossly normal. Mediastinum/Nodes: No mediastinal or hilar mass or lymphadenopathy. The esophagus is unremarkable. Lungs/Pleura: No acute pulmonary findings. No pulmonary edema, pleural effusions or pulmonary infiltrates. No interstitial lung disease or bronchiectasis. Calcified  left hilar lymph nodes and left upper lobe calcified granuloma likely related to remote granulomatous disease. Musculoskeletal: No significant bony findings. Review of the MIP images confirms the above findings. CTA ABDOMEN AND PELVIS FINDINGS VASCULAR Aorta: Normal Celiac: Normal SMA: Normal Renals: Normal IMA: Normal Inflow: Normal Veins: Normal Review of the MIP images confirms the above findings. NON-VASCULAR Hepatobiliary: No hepatic lesions or intrahepatic biliary dilatation. The gallbladder is unremarkable. No common bile duct dilatation. Pancreas: No mass, inflammation or ductal dilatation. Spleen: Normal size. Small scattered calcified granulomas. Adrenals/Urinary Tract: Adrenal glands and kidneys are. The bladder is unremarkable. Stomach/Bowel: The stomach, duodenum, small bowel and colon are unremarkable. No acute inflammatory changes, mass lesions or obstructive findings. The terminal ileum and appendix are normal. Scattered colonic diverticulosis. Lymphatic: No abdominal or pelvic lymphadenopathy. Reproductive: The prostate gland and seminal vesicles are normal. Other: No pelvic mass or adenopathy. No free pelvic fluid collections. No inguinal mass or adenopathy. No abdominal wall hernia or subcutaneous lesions. Musculoskeletal: No significant bony findings. Review of the MIP images confirms the above findings. IMPRESSION: 1. Normal thoracic and abdominal aorta. No aneurysm or dissection. 2. No acute pulmonary findings. 3. Remote granulomatous disease involving the left lung and spleen. 4. No acute abdominal/pelvic findings, mass lesions or lymphadenopathy. Electronically Signed   By: Marijo Sanes M.D.   On: 12/21/2021 11:16   DG Chest 2 View  Result Date: 12/21/2021 CLINICAL DATA:  Chest pain EXAM: CHEST - 2 VIEW COMPARISON:  08/15/2018 FINDINGS: The heart size and mediastinal contours are within normal limits. Both lungs are clear. The visualized skeletal structures are unremarkable. IMPRESSION:  No active cardiopulmonary disease. Electronically Signed   By: Elmer Picker M.D.   On: 12/21/2021 10:00    Disposition   Pt is being discharged home today in good condition.  Follow-up Plans & Appointments     Discharge Instructions     Diet - low sodium heart healthy   Complete by: As directed    Increase activity slowly   Complete by: As directed        Discharge Medications   Allergies as of 01/11/2022  No Known Allergies      Medication List     TAKE these medications    acetaminophen 325 MG tablet Commonly known as: TYLENOL Take 2 tablets (650 mg total) by mouth every 4 (four) hours as needed for headache or mild pain.   aspirin 81 MG chewable tablet Chew 1 tablet (81 mg total) by mouth daily.   atorvastatin 80 MG tablet Commonly known as: LIPITOR Take 1 tablet (80 mg total) by mouth daily. What changed: when to take this   B-COMPLEX/B-12 PO Take 1 tablet by mouth daily.   D3 5000 PO Take 1 tablet by mouth daily.   levothyroxine 88 MCG tablet Commonly known as: SYNTHROID Take 88 mcg by mouth daily before breakfast.   MAGNESIUM PO Take 1 tablet by mouth daily.   metFORMIN 500 MG tablet Commonly known as: GLUCOPHAGE Take 500 mg by mouth daily.   metoprolol succinate 50 MG 24 hr tablet Commonly known as: TOPROL-XL Take 1 tablet (50 mg total) by mouth daily. What changed:  medication strength how much to take   multivitamin tablet Take 1 tablet by mouth daily.   NICOTINAMIDE PO Take 1 capsule by mouth daily.   nitroGLYCERIN 0.4 MG SL tablet Commonly known as: NITROSTAT Place 1 tablet (0.4 mg total) under the tongue every 5 (five) minutes as needed for chest pain.   OMEGA-3 PLUS PO Take 1 capsule by mouth daily.   ticagrelor 90 MG Tabs tablet Commonly known as: BRILINTA Take 1 tablet (90 mg total) by mouth 2 (two) times daily.   zinc gluconate 50 MG tablet Take 50 mg by mouth daily.           Outstanding Labs/Studies    None  Duration of Discharge Encounter   Greater than 30 minutes including physician time.  Signed, Glenetta Hew, MD 01/11/2022, 3:16 PM   I saw and examined the patient this morning on discharge along with Mr. Curly [place], [place].  We reviewed the chart and all available data.  Appreciate psychiatric consultation.  Plans for outpatient psychology counseling.  This clearly seems to be beneficial for him.  Changing his outlook acknowledging that he he can get back into full activity within 6 to 8 weeks of his MI gradually building forward.  He will strongly benefit from cardiac rehab.  We need to get that arranged.  I think he stable for discharge.  Anticipated potentially discharging him yesterday, but because of patient emergencies, was not able to be seen till later on the evening.  The benefit of overnight observation was ensuring that he had no arrhythmias and no further panic episodes.  Moreover, he was able to be seen by psychiatry for consultation.  Glenetta Hew, MD

## 2022-01-17 DIAGNOSIS — J040 Acute laryngitis: Secondary | ICD-10-CM | POA: Diagnosis not present

## 2022-01-19 ENCOUNTER — Telehealth: Payer: Self-pay | Admitting: *Deleted

## 2022-01-19 NOTE — Telephone Encounter (Signed)
Called Mr. Theil to discuss the [place] (a) trial. He is scheduled next week for his screening visit.

## 2022-01-23 DIAGNOSIS — E782 Mixed hyperlipidemia: Secondary | ICD-10-CM | POA: Diagnosis not present

## 2022-01-23 DIAGNOSIS — Z20822 Contact with and (suspected) exposure to covid-19: Secondary | ICD-10-CM | POA: Diagnosis not present

## 2022-01-23 DIAGNOSIS — R6889 Other general symptoms and signs: Secondary | ICD-10-CM | POA: Diagnosis not present

## 2022-01-23 DIAGNOSIS — E039 Hypothyroidism, unspecified: Secondary | ICD-10-CM | POA: Diagnosis not present

## 2022-01-23 DIAGNOSIS — I2121 ST elevation (STEMI) myocardial infarction involving left circumflex coronary artery: Secondary | ICD-10-CM | POA: Diagnosis not present

## 2022-01-23 DIAGNOSIS — E291 Testicular hypofunction: Secondary | ICD-10-CM | POA: Diagnosis not present

## 2022-01-23 DIAGNOSIS — Z125 Encounter for screening for malignant neoplasm of prostate: Secondary | ICD-10-CM | POA: Diagnosis not present

## 2022-01-25 VITALS — BP 106/66 | HR 54 | Ht 71.0 in | Wt 243.2 lb

## 2022-01-25 DIAGNOSIS — Z006 Encounter for examination for normal comparison and control in clinical research program: Secondary | ICD-10-CM

## 2022-01-25 NOTE — Research (Signed)
Todd [name] Informed Consent   Subject Name: Todd [name]  Subject met inclusion and exclusion criteria.  The informed consent form, study requirements and expectations were reviewed with the subject and questions and concerns were addressed prior to the signing of the consent form.  The subject verbalized understanding of the trial requirements.  The subject agreed to participate in the [facility] trial and signed the informed consent at 8:50am on 01/25/2022.  The informed consent was obtained prior to performance of any protocol-specific procedures for the subject.  A copy of the signed informed consent was given to the subject and a copy was placed in the subject's medical record.   Todd [name]  [employer] Consent Version 5 Protocol Version 1 amendment 3   OCEANa Screening Visit [phone] SUBJECT ID:                           DATE:                                  The following was completed during visit: _0  CONSENT SIGNED _1  INCLUSION/EXCLUSION MET _2  MEASUREMENTS TAKEN HEIGHT:     5'11"              WEIGHT: 243.2 WAIST CIRMCUMFERENCE:   112cm            HIP CIRCUMFERENCE:116cm B/P:  106/66                          HR: 54 _3  SERUM PREGNANCY TEST/FSH _4  FASTING GLUCOSE/HbA1c _5  FASTING LIPID PANELY _6  LPa _7  CHEMISTRY/HEMATOLOGY _8  MEDICATIONS REVIEWED _9  DEMOGRAPHICS    Patient was seen today in the research clinic for the Screening Visit for [name]. Consent reviewed with patient, all questions answered. Patient signed consent and is in agreement to proceed with the screening visit for the trial. All concomitant medications reviewed and no changes noted at this time. Denies any recent adverse events or hospitalizations. All procedures completed per protocol and patient tolerated well without any complaints. Explained to patient that I will call him once all lab work has been completed to see if  he still qualifies before making an appointment for  randomization. He verbalized understanding.  Todd [name] DEMOGRAPHICS [id number]      SITE [id number] SUBJECT ID: [id number]                          DATE:    01/25/2022      _10  MALE                            _11  MALE AGE:46 ETHINICITY:   _12  HISPANIC/LATINO      _13  NON- HISPANIC/LATINO RACE:           _14   WHITE             _15  BLACK/AFRICAN AMERICAN                         _16   ASIAN              _17  AMERICAN INDIAN/[place] NATIVE                         _18   NATIVE [place]/OTHER PACIFIC ISLANDER                         _19   OTHER  FUTURE RESEARCH _0  USE OF SAMPLES FOR FUTURE RESEARCH _1  PHARMACOGENTIC (GENETIC) RESEARCH    Current Outpatient Medications:    aspirin 81 MG chewable tablet, Chew 1 tablet (81 mg total) by mouth daily., Disp: , Rfl:    atorvastatin (LIPITOR) 80 MG tablet, Take 1 tablet (80 mg total) by mouth daily. (Patient taking differently: Take 80 mg by mouth at bedtime.), Disp: 30 tablet, Rfl: 6   B Complex Vitamins (B-COMPLEX/B-12 PO), Take 1 tablet by mouth daily., Disp: , Rfl:    Cholecalciferol (D3 5000 PO), Take 1 tablet by mouth daily., Disp: , Rfl:    levothyroxine (SYNTHROID) 88 MCG tablet, Take 88 mcg by mouth daily before breakfast., Disp: , Rfl:    MAGNESIUM PO, Take 1 tablet by mouth daily., Disp: , Rfl:    metFORMIN (GLUCOPHAGE) 500 MG tablet, Take 500 mg by mouth daily., Disp: , Rfl:    metoprolol succinate (TOPROL-XL) 50 MG 24 hr tablet, Take 1 tablet (50 mg total) by mouth daily., Disp: 30 tablet, Rfl: 6   Multiple Vitamin (MULTIVITAMIN) tablet, Take 1 tablet by mouth daily., Disp: , Rfl:    Niacinamide-Zn-Cu-Methfo-Se-Cr (NICOTINAMIDE PO), Take 1 capsule by mouth daily., Disp: , Rfl:    nitroGLYCERIN (NITROSTAT) 0.4 MG SL tablet, Place 1 tablet (0.4 mg total) under the tongue every 5 (five) minutes as needed for chest pain., Disp: 25 tablet, Rfl: 3   Omega-3 Fatty Acids (OMEGA-3 PLUS PO), Take 1 capsule by mouth daily., Disp: , Rfl:    ticagrelor  (BRILINTA) 90 MG TABS tablet, Take 1 tablet (90 mg total) by mouth 2 (two) times daily., Disp: 60 tablet, Rfl: 11   zinc gluconate 50 MG tablet, Take 50 mg by mouth daily., Disp: , Rfl:    acetaminophen (TYLENOL) 325 MG tablet, Take 2 tablets (650 mg total) by mouth every 4 (four) hours as needed for headache or mild pain. (Patient not taking: Reported on 01/10/2022), Disp: , Rfl:

## 2022-02-05 ENCOUNTER — Ambulatory Visit (INDEPENDENT_AMBULATORY_CARE_PROVIDER_SITE_OTHER): Payer: BC Managed Care – PPO | Admitting: Psychologist

## 2022-02-05 DIAGNOSIS — F4521 Hypochondriasis: Secondary | ICD-10-CM

## 2022-02-05 NOTE — Plan of Care (Signed)

## 2022-02-05 NOTE — Research (Addendum)
Screening visit labs  Is the patient eligible to continue enrollment in the study after screening visit?  Yes [x]   OR No[]   LP(a) appears to be high enough  Pixie Casino, MD, [facility] [place] Director of the Advanced Lipid Disorders &  Cardiovascular Risk Reduction Clinic Diplomate of the American Board of Clinical Lipidology Attending Cardiologist  Direct Dial: [phone]  Fax: [phone]  Website:  [email or web address]

## 2022-02-05 NOTE — Progress Notes (Signed)
                Clive Parcel, PsyD 

## 2022-02-05 NOTE — Progress Notes (Signed)
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  Mental Status Exam: Appearance:   Well Groomed     Behavior:  Appropriate  Motor:  Normal  Speech/Language:   Clear and Coherent  Affect:  Appropriate  Mood:  normal  Thought process:  normal  Thought content:    WNL  Sensory/Perceptual disturbances:    WNL  Orientation:  oriented to person, place, and time/date  Attention:  Good  Concentration:  Good  Memory:  WNL  Fund of knowledge:   Good  Insight:    Good  Judgment:   Good  Impulse Control:  Good    Reported Symptoms:  The patient endorsed experiencing the following:preoccupation with a serious illness, and high level of anxiety of health. He denied suicidal and homicidal ideation.   Risk Assessment: Danger to Self:  No Self-injurious Behavior: No Danger to Others: No Duty to Warn:no Physical Aggression / Violence:No  Access to Firearms a concern: No  Gang Involvement:No  Patient / guardian was educated about steps to take if suicide or homicide risk level increases between visits: n/a While future psychiatric events cannot be accurately predicted, the patient does not currently require acute inpatient psychiatric care and does not currently meet [facility] involuntary commitment criteria.  Substance Abuse History: Current substance abuse: No     Past Psychiatric History:   Previous psychological history is significant for Grief Outpatient Providers:NA History of Psych Hospitalization: No  Psychological Testing:  NA    Abuse History:  Victim of: No.,  NA    Report needed: No. Victim of Neglect:No. Perpetrator of  NA   Witness / Exposure to Domestic Violence: No   Protective  Services Involvement: No  Witness to [employer] Violence:  No   Family History:  Family History  Problem Relation Age of Onset   Heart attack Mother 55    Living situation: the patient lives with their family  Sexual Orientation: Straight  Relationship Status: married  Name of spouse / other:Ping-Ting If a parent, number of children / ages:Maverick who is 4 and Iris who is 11  Support Systems: spouse friends  Surveyor, quantity Stress:  No   Income/Employment/Disability: Employment  Financial planner: No   Educational History: Education: post Engineer, maintenance (IT) work or degree  Religion/Sprituality/World View: Catholic  Any cultural differences that may affect / interfere with treatment:  not applicable   Recreation/Hobbies: Working out  Stressors: Other: MI, and managing other stressors    Strengths: Supportive Relationships  Barriers:  NA   Legal History: Pending legal issue / charges: The patient has no significant history of legal issues. History of legal issue / charges:  NA  Medical History/Surgical History: reviewed Past Medical History:  Diagnosis Date   Hyperlipidemia    NSVT (nonsustained ventricular tachycardia) (HCC) 12/23/2021   S/P angioplasty with stent s/p LCX PCI and OM2 POBA 12/21/21  12/23/2021   Thyroid disease     Past Surgical History:  Procedure Laterality Date   CORONARY/GRAFT ACUTE MI REVASCULARIZATION N/A 12/21/2021   Procedure: Coronary/Graft Acute MI Revascularization;  Surgeon: Kathleene Hazel, MD;  [facility]: [facility];  Service: Cardiovascular;  Laterality: N/A;   EYE SURGERY     LEFT HEART CATH AND  CORONARY ANGIOGRAPHY N/A 12/21/2021   Procedure: LEFT HEART CATH AND CORONARY ANGIOGRAPHY;  Surgeon: Burnell Blanks, MD;  Location: [place] CV LAB;  Service: Cardiovascular;  Laterality: N/A;    Medications: Current Outpatient Medications  Medication Sig Dispense Refill   acetaminophen (TYLENOL) 325 MG tablet Take 2  tablets (650 mg total) by mouth every 4 (four) hours as needed for headache or mild pain. (Patient not taking: Reported on 01/10/2022)     aspirin 81 MG chewable tablet Chew 1 tablet (81 mg total) by mouth daily.     atorvastatin (LIPITOR) 80 MG tablet Take 1 tablet (80 mg total) by mouth daily. (Patient taking differently: Take 80 mg by mouth at bedtime.) 30 tablet 6   B Complex Vitamins (B-COMPLEX/B-12 PO) Take 1 tablet by mouth daily.     Cholecalciferol (D3 5000 PO) Take 1 tablet by mouth daily.     levothyroxine (SYNTHROID) 88 MCG tablet Take 88 mcg by mouth daily before breakfast.     MAGNESIUM PO Take 1 tablet by mouth daily.     metFORMIN (GLUCOPHAGE) 500 MG tablet Take 500 mg by mouth daily.     metoprolol succinate (TOPROL-XL) 50 MG 24 hr tablet Take 1 tablet (50 mg total) by mouth daily. 30 tablet 6   Multiple Vitamin (MULTIVITAMIN) tablet Take 1 tablet by mouth daily.     Niacinamide-Zn-Cu-Methfo-Se-Cr (NICOTINAMIDE PO) Take 1 capsule by mouth daily.     nitroGLYCERIN (NITROSTAT) 0.4 MG SL tablet Place 1 tablet (0.4 mg total) under the tongue every 5 (five) minutes as needed for chest pain. 25 tablet 3   Omega-3 Fatty Acids (OMEGA-3 PLUS PO) Take 1 capsule by mouth daily.     ticagrelor (BRILINTA) 90 MG TABS tablet Take 1 tablet (90 mg total) by mouth 2 (two) times daily. 60 tablet 11   zinc gluconate 50 MG tablet Take 50 mg by mouth daily.     No current facility-administered medications for this visit.    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 The patient lives at home with his wife, two children, two dogs, one cat, and sometimes other family members. 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 He  denied suicidal and homicidal ideation.   The patient stated that he wants coping skills and to process emotions and thoughts.   This psychologist makes the recommendation that the patient participate in therapy once a month.    Conception Chancy, PsyD

## 2022-02-12 ENCOUNTER — Encounter: Payer: BC Managed Care – PPO | Admitting: *Deleted

## 2022-02-12 VITALS — BP 108/72 | HR 54 | Temp 98.5°F | Ht 71.0 in | Wt 243.0 lb

## 2022-02-12 DIAGNOSIS — Z006 Encounter for examination for normal comparison and control in clinical research program: Secondary | ICD-10-CM

## 2022-02-12 MED ORDER — STUDY - OCEAN(A) - OLPASIRAN (AMG 890) 142 MG/ML OR PLACEBO SQ INJECTION (PI-HILTY)
142.0000 mg | PREFILLED_SYRINGE | Freq: Once | SUBCUTANEOUS | Status: AC
  Administered 2022-02-12: 142 mg via SUBCUTANEOUS
  Filled 2022-02-12: qty 1

## 2022-02-12 NOTE — Research (Addendum)
AMGEN [id number] SITE #: [id number] SUBJECT ID#: [id number]                    ELIGIBLITY CRITERIA WORKSHEET                             INCLUSION CRITERIA PROVIDED INFORMED CONSENT _0    AGE 48 TO ? 29          _1    Lp(a) ? 200 nmol/L during screening by central lab _2    MI (presumed type 1 event due to plaque rupture       AND/OR _3    PCI stenting AND one of the following: _4    AGE ? 73 _5    Residual coronary artery stenosis > 50% in any major vessel _6    Multivessel PCI _7    Symptomatic peripheral arterial disease with either (a) intermittent claudication with ankle-brachial index (ABI) < 0.85, or (b) peripheral arterial revascularization or amputation due to atherosclerotic disease _8    Ischemic stroke (presumed atherosclerotic in origin) _9    DM    HbA1c:  _________________ _10                         EXCLUSION CRITERIA           N/A                          _11  Severe renal function eGFR < 44m/min/1.73 m _12    Active liver disease, known hepatitis, or any hepatic dysfunction, defined as AST or ALT > 3 x upper limit of normal, or total bilirubin > 2 x ULN during screening _13    History of hemorrhagic stroke _14    History of major bleeding disorder _15    Major cardiovascular event within 4 weeks prior to Lp(a) screening or during screening _16    Planned cardiac or arterial revascularization _17    Malignancy within the last 5 years prior to Study day 1 (except non-melanoma skin cancers, cervical and breast ductal carcinoma in situ, or stage 1 prostate) _18    Severe Heart Failure Class IV, and/or left EF < 30% _19    Uncontrolled or recurrent Ventricular tachycardia in the last 3 months _20    A fib/flutter not on therapeutic anticoagulation or having placement of left atrial appendage closure device _21    Uncontrolled HTN at screening, systolic > 1361WERXor diastolic > 1540GQQP<<[id number]>_9<\/[id number]>_19   Fasting triglycerides > 4011mdL during screening _23    HbA1c > 10% _24    Know major active infection _25    Current or  planned lipoprotein apheresis or < 3 months since last apheresis _26    Previously received RNA therapy specifically targeting Lp(a) _27           OCEANa DEMOGRAPHICS [id number]    SITE [id number] ID:                        _28  MALE                            _29  MALE AGE: [id number]:   _30  HISPANIC/LATINO      _31  NON- HISPANIC/LATINO RACE:           _32   WHITE             _33  BLACK/AFRICAN AMERICAN                         _34   ASIAN              []  AMERICAN INDIAN/[place] NATIVE                         []   NATIVE [place]/OTHER PACIFIC ISLANDER                         []   OTHER   FUTURE RESEARCH [x]  USE OF SAMPLES FOR FUTURE RESEARCH [x]  PHARMACOGENTIC (GENETIC) RESEARCH     Patient was seen in the clinic for Randomization visit for [name] trial. Reviewed informed consent, medications and inclusion/exclusion criteria with patient. He denies any adverse events since last visit. ECG and lab work were completed per the protocol. IP injection was given. Patient tolerated all procedures well without complaints. Patient did wait in clinic 30 minutes  after IP injection was given. Next appointment is Week 4 and was made on 03/12/2022 at 8:30. Patient to return between  11:10-11:30  pm today for PK lab draw  [name] IP ADMINISTRATION Subject ZO#[id number] IP Box #:[id number] IP lot#: [id number] Time Given: 10:09am Administration site: Right arm Weight: 243 lb Time: Patient returned to clinic for PK lab draw. Lab was drawn at: 11:20am   Current Outpatient Medications:    aspirin 81 MG chewable tablet, Chew 1 tablet (81 mg total) by mouth daily., Disp: , Rfl:    atorvastatin (LIPITOR) 80 MG tablet, Take 1 tablet (80 mg total) by mouth daily. (Patient taking differently: Take 80 mg by mouth at bedtime.), Disp: 30 tablet, Rfl: 6   B Complex Vitamins (B-COMPLEX/B-12 PO), Take 1 tablet by mouth daily., Disp: , Rfl:    Cholecalciferol (D3 5000 PO), Take 1 tablet by mouth daily., Disp:  , Rfl:    levothyroxine (SYNTHROID) 112 MCG tablet, Take 112 mcg by mouth daily., Disp: , Rfl:    MAGNESIUM PO, Take 1 tablet by mouth daily., Disp: , Rfl:    metFORMIN (GLUCOPHAGE) 500 MG tablet, Take 500 mg by mouth daily., Disp: , Rfl:    metoprolol succinate (TOPROL-XL) 50 MG 24 hr tablet, Take 1 tablet (50 mg total) by mouth daily., Disp: 30 tablet, Rfl: 6   Multiple Vitamin (MULTIVITAMIN) tablet, Take 1 tablet by mouth daily., Disp: , Rfl:    Niacinamide-Zn-Cu-Methfo-Se-Cr (NICOTINAMIDE PO), Take 1 capsule by mouth daily., Disp: , Rfl:    nitroGLYCERIN (NITROSTAT) 0.4 MG SL tablet, Place 1 tablet (0.4 mg total) under the tongue every 5 (five) minutes as needed for chest pain., Disp: 25 tablet, Rfl: 3   Omega-3 Fatty Acids (OMEGA-3 PLUS PO), Take 1 capsule by mouth daily., Disp: , Rfl:    Study - [name] - olpasiran (AMG 890) 142 mg/mL or placebo SQ injection (PI-Hilty), Inject 142 mg into the skin once. For Investigational Use Only. Inject 1 mL (1 prefilled syringe) subcutaneously into appropriate injection site per protocol every 12 weeks. (Approved injection site(s): upper arm, upper thigh & abdomen). Please reach out to [facility] Research for any questions or concerns regarding this medication., Disp: , Rfl:    ticagrelor (BRILINTA) 90 MG TABS tablet, Take 1 tablet (90 mg total) by mouth 2 (two) times daily., Disp: 60 tablet, Rfl: 11   zinc gluconate 50 MG tablet, Take 50 mg by mouth daily., Disp: , Rfl:    acetaminophen (TYLENOL) 325 MG tablet, Take 2 tablets (650 mg total) by mouth every 4 (four) hours as needed for headache or mild pain. (Patient  not taking: Reported on 01/10/2022), Disp: , Rfl:

## 2022-02-12 NOTE — Progress Notes (Unsigned)
Patient seen today for randomization visit.  He is followed by Dr. Angelena Form.  He presented with an acute lateral MI with occlusion of the CFX.  He had stents placed in the CFX and has done well.  He is anxious to get on with cardiac rehab.  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  He had a brief readmission for chest pain.  Begins rehab Nov 6.    We had an extensive discussion today regarding Ocean a.  Risks, benefits, and nature of randomization discussed in detail.  We discussed Lp(a) elevation and known associations in detail.  All questions were answered.  He meets inclusion and exclusion criteria for the Ocean investigation and wishes to proceed.   Alert , oriented male in no distress BP 108/72 P54 T 98.5 T36.9 R regular No JVD Lungs clear Cor reg Abd benign Ext no edema Neuro non focal  ECG  -SB.    Impression:  CAD Elevated Lp(a)  Plan   Stress residual risk reduction  Extensive trial discussion  Randomization and IP initiation Continue follow up with Darlina Guys, MD, [facility]

## 2022-02-15 ENCOUNTER — Ambulatory Visit: Payer: BC Managed Care – PPO | Attending: Physician Assistant

## 2022-02-15 DIAGNOSIS — E782 Mixed hyperlipidemia: Secondary | ICD-10-CM | POA: Diagnosis not present

## 2022-02-15 LAB — LIPID PANEL
Chol/HDL Ratio: 4 ratio (ref 0.0–5.0)
Cholesterol, Total: 174 mg/dL (ref 100–199)
HDL: 44 mg/dL
LDL Chol Calc (NIH): 104 mg/dL — ABNORMAL HIGH (ref 0–99)
Triglycerides: 147 mg/dL (ref 0–149)
VLDL Cholesterol Cal: 26 mg/dL (ref 5–40)

## 2022-02-19 ENCOUNTER — Encounter: Payer: Self-pay | Admitting: Cardiovascular Disease

## 2022-02-19 ENCOUNTER — Ambulatory Visit: Payer: BC Managed Care – PPO | Attending: Cardiovascular Disease | Admitting: Cardiovascular Disease

## 2022-02-19 VITALS — BP 100/82 | HR 77 | Ht 71.0 in | Wt 251.2 lb

## 2022-02-19 DIAGNOSIS — I10 Essential (primary) hypertension: Secondary | ICD-10-CM | POA: Diagnosis not present

## 2022-02-19 DIAGNOSIS — I2510 Atherosclerotic heart disease of native coronary artery without angina pectoris: Secondary | ICD-10-CM | POA: Diagnosis not present

## 2022-02-19 DIAGNOSIS — E782 Mixed hyperlipidemia: Secondary | ICD-10-CM

## 2022-02-19 MED ORDER — METOPROLOL SUCCINATE ER 25 MG PO TB24: 25.0000 mg | ORAL_TABLET | Freq: Every day | ORAL | 3 refills | Status: DC

## 2022-02-19 NOTE — Patient Instructions (Signed)
Medication Instructions:  Your physician has recommended you make the following change in your medication:  1.) decrease metoprolol succinate (Toprol XL) to 25 mg daily  *If you need a refill on your cardiac medications before your next appointment, please call your pharmacy*   Lab Work: Please return in 3 months for fasting lipids/liver function  If you have labs (blood work) drawn today and your tests are completely normal, you will receive your results only by: [place] (if you have MyChart) OR A paper copy in the mail If you have any lab test that is abnormal or we need to change your treatment, we will call you to review the results.   Testing/Procedures: none   Follow-Up: At [facility], you and your health needs are our priority.  As part of our continuing mission to provide you with exceptional heart care, we have created designated Provider Care Teams.  These Care Teams include your primary Cardiologist (physician) and Advanced Practice Providers (APPs -  Physician Assistants and Nurse Practitioners) who all work together to provide you with the care you need, when you need it.   Your next appointment:   6 month(s)  The format for your next appointment:   In Person  Provider:   Lauree Chandler, MD     Other Instructions We will send a message to cardiac rehab about where you fall on their list and we will call you to let you know.  Important Information About Sugar

## 2022-02-19 NOTE — Progress Notes (Signed)
Chief Complaint  Patient presents with   Follow-up    CAD   History of Present Illness: 46 yo male with history of CAD, hyperlipidemia. HTN, hypothyoridism and VT who is here today for cardiac follow up. He was admitted to [facility] with a lateral STEMI in September 2023. He presented to [facility] ED with chest pain and had a cardiac arrest. He was transferred to [facility] for emergent cardiac cath which showed an occluded proximal Circumflex. Two drug eluting stents were placed. OM2 was treated with balloon angioplasty. Mild disease in the LAD and RCA. Echo 12/22/21 with LVEF=50-55%. No valve disease.   He is here today for follow up. The patient denies any chest pain, dyspnea, palpitations, lower extremity edema, orthopnea, PND, dizziness, near syncope or syncope.   Primary Care Physician: Curly Rim, MD   Past Medical History:  Diagnosis Date   Hyperlipidemia    NSVT (nonsustained ventricular tachycardia) ([place]) 12/23/2021   S/P angioplasty with stent s/p LCX PCI and OM2 POBA 12/21/21  12/23/2021   Thyroid disease     Past Surgical History:  Procedure Laterality Date   CORONARY/GRAFT ACUTE MI REVASCULARIZATION N/A 12/21/2021   Procedure: Coronary/Graft Acute MI Revascularization;  Surgeon: Burnell Blanks, MD;  Location: [place] CV LAB;  Service: Cardiovascular;  Laterality: N/A;   EYE SURGERY     LEFT HEART CATH AND CORONARY ANGIOGRAPHY N/A 12/21/2021   Procedure: LEFT HEART CATH AND CORONARY ANGIOGRAPHY;  Surgeon: Burnell Blanks, MD;  Location: [place] CV LAB;  Service: Cardiovascular;  Laterality: N/A;    Current Outpatient Medications  Medication Sig Dispense Refill   acetaminophen (TYLENOL) 325 MG tablet Take 2 tablets (650 mg total) by mouth every 4 (four) hours as needed for headache or mild pain.     aspirin 81 MG chewable tablet Chew 1 tablet (81 mg total) by mouth daily.     atorvastatin (LIPITOR) 80 MG tablet Take 1 tablet (80 mg total) by mouth daily.  (Patient taking differently: Take 80 mg by mouth at bedtime.) 30 tablet 6   B Complex Vitamins (B-COMPLEX/B-12 PO) Take 1 tablet by mouth daily.     Cholecalciferol (D3 5000 PO) Take 1 tablet by mouth daily.     levothyroxine (SYNTHROID) 112 MCG tablet Take 112 mcg by mouth daily.     MAGNESIUM PO Take 1 tablet by mouth daily.     metFORMIN (GLUCOPHAGE) 500 MG tablet Take 500 mg by mouth daily.     metoprolol succinate (TOPROL XL) 25 MG 24 hr tablet Take 1 tablet (25 mg total) by mouth daily. 90 tablet 3   Multiple Vitamin (MULTIVITAMIN) tablet Take 1 tablet by mouth daily.     Niacinamide-Zn-Cu-Methfo-Se-Cr (NICOTINAMIDE PO) Take 1 capsule by mouth daily.     nitroGLYCERIN (NITROSTAT) 0.4 MG SL tablet Place 1 tablet (0.4 mg total) under the tongue every 5 (five) minutes as needed for chest pain. 25 tablet 3   Omega-3 Fatty Acids (OMEGA-3 PLUS PO) Take 1 capsule by mouth daily.     Study - [name] - olpasiran (AMG 890) 142 mg/mL or placebo SQ injection (PI-Hilty) Inject 142 mg into the skin once. For Investigational Use Only. Inject 1 mL (1 prefilled syringe) subcutaneously into appropriate injection site per protocol every 12 weeks. (Approved injection site(s): upper arm, upper thigh & abdomen). Please reach out to [place] for any questions or concerns regarding this medication.     ticagrelor (BRILINTA) 90 MG TABS tablet Take  1 tablet (90 mg total) by mouth 2 (two) times daily. 60 tablet 11   zinc gluconate 50 MG tablet Take 50 mg by mouth daily.     No current facility-administered medications for this visit.    No Known Allergies  Social History   Socioeconomic History   Marital status: Married    Spouse name: Not on file   Number of children: Not on file   Years of education: Not on file   Highest education level: Not on file  Occupational History   Not on file  Tobacco Use   Smoking status: Never   Smokeless tobacco: Not on file  Substance and Sexual  Activity   Alcohol use: No   Drug use: No   Sexual activity: Not on file  Other Topics Concern   Not on file  Social History Narrative   Not on file   Social Determinants of Health   Financial Resource Strain: Not on file  Food Insecurity: No Food Insecurity (12/29/2021)   Hunger Vital Sign    Worried About Running Out of Food in the Last Year: Never true    Ran Out of Food in the Last Year: Never true  Transportation Needs: No Transportation Needs (12/29/2021)   PRAPARE - Administrator, Civil Service (Medical): No    Lack of Transportation (Non-Medical): No  Physical Activity: Not on file  Stress: Not on file  Social Connections: Not on file  Intimate Partner Violence: Not At Risk (12/29/2021)   Humiliation, Afraid, Rape, and Kick questionnaire    Fear of Current or Ex-Partner: No    Emotionally Abused: No    Physically Abused: No    Sexually Abused: No    Family History  Problem Relation Age of Onset   Heart attack Mother 75    Review of Systems:  As stated in the HPI and otherwise negative.   BP 100/82   Pulse 77   Ht 5\' 11"  (1.803 m)   Wt 251 lb 3.2 oz (113.9 kg)   SpO2 98%   BMI 35.04 kg/m   Physical Examination: General: Well developed, well nourished, NAD  HEENT: OP clear, mucus membranes moist  SKIN: warm, dry. No rashes. Neuro: No focal deficits  Musculoskeletal: Muscle strength 5/5 all ext  Psychiatric: Mood and affect normal  Neck: No JVD, no carotid bruits, no thyromegaly, no lymphadenopathy.  Lungs:Clear bilaterally, no wheezes, rhonci, crackles Cardiovascular: Regular rate and rhythm. No murmurs, gallops or rubs. Abdomen:Soft. Bowel sounds present. Non-tender.  Extremities: No lower extremity edema. Pulses are 2 + in the bilateral DP/PT.  EKG:  EKG is not ordered today. The ekg ordered today demonstrates   Echo 12/22/21:  1. Hypokinesis of the inferolateral wall with overall low normal LV  function.   2. Left ventricular ejection  fraction, by estimation, is 50 to 55%. The  left ventricle has low normal function. The left ventricle demonstrates  regional wall motion abnormalities (see scoring diagram/findings for  description). Left ventricular diastolic   parameters are consistent with Grade I diastolic dysfunction (impaired  relaxation).   3. Right ventricular systolic function is normal. The right ventricular  size is normal.   4. The mitral valve is normal in structure. Trivial mitral valve  regurgitation. No evidence of mitral stenosis.   5. The aortic valve is tricuspid. Aortic valve regurgitation is not  visualized. Aortic valve sclerosis is present, with no evidence of aortic  valve stenosis.   6. The inferior vena  cava is normal in size with greater than 50%  respiratory variability, suggesting right atrial pressure of 3 mmHg.    Recent Labs: 12/21/2021: Magnesium 2.1 12/22/2021: TSH 3.368 01/09/2022: ALT 32; BUN 16; Creatinine, Ser 1.08; Hemoglobin 16.8; Platelets 202; Potassium 3.8; Sodium 138   Lipid Panel    Component Value Date/Time   CHOL 174 02/15/2022 0901   TRIG 147 02/15/2022 0901   HDL 44 02/15/2022 0901   CHOLHDL 4.0 02/15/2022 0901   CHOLHDL 5.3 12/21/2021 1159   VLDL 25 12/21/2021 1159   LDLCALC 104 (H) 02/15/2022 0901     Wt Readings from Last 3 Encounters:  02/19/22 251 lb 3.2 oz (113.9 kg)  02/12/22 243 lb (110.2 kg)  01/25/22 243 lb 3.2 oz (110.3 kg)    Assessment and Plan:   1. CAD without angina: No chest pain suggestive of angina. Continue DAPT with ASA and Brilinta. Continue statin and beta blocker. Will lower Toprol to 25 mg per day since he thinks this is affecting his libido. He is still waiting to start cardiac rehab. I have told him to go ahead and start exercising.   2. HTN: BP is controlled. No changes  3. Hyperlipidemia: Continue statin. LDL was 104 last week but he has not been exercising post MI. He will begin exercising, watch his diet and we will repeat lipids  and LFTs in 12 weeks.   Labs/ tests ordered today include:   Orders Placed This Encounter  Procedures   Hepatic function panel   Lipid panel   Disposition:   F/U with me in 6 months  Signed, Lauree Chandler, MD, [facility] 02/19/2022 3:31 PM    [facility] [place], [place], [place]  [id number] Phone: [phone]; Fax: [phone]

## 2022-02-20 DIAGNOSIS — L308 Other specified dermatitis: Secondary | ICD-10-CM | POA: Diagnosis not present

## 2022-02-20 DIAGNOSIS — L2089 Other atopic dermatitis: Secondary | ICD-10-CM | POA: Diagnosis not present

## 2022-02-20 DIAGNOSIS — L810 Postinflammatory hyperpigmentation: Secondary | ICD-10-CM | POA: Diagnosis not present

## 2022-02-20 DIAGNOSIS — E669 Obesity, unspecified: Secondary | ICD-10-CM | POA: Diagnosis not present

## 2022-02-27 ENCOUNTER — Ambulatory Visit: Payer: BC Managed Care – PPO | Admitting: Psychologist

## 2022-03-01 ENCOUNTER — Encounter: Payer: Self-pay | Admitting: Cardiovascular Disease

## 2022-03-02 ENCOUNTER — Ambulatory Visit (INDEPENDENT_AMBULATORY_CARE_PROVIDER_SITE_OTHER): Payer: BC Managed Care – PPO | Admitting: Psychologist

## 2022-03-02 DIAGNOSIS — F4521 Hypochondriasis: Secondary | ICD-10-CM | POA: Diagnosis not present

## 2022-03-02 NOTE — Progress Notes (Signed)
[facility] Counselor/Therapist Progress Note  Patient ID: Todd [name], MRN: [id number],    Date: 03/02/2022  Time Spent: 10:05 am to 10:45 am; total time: 40 minutes   This session was held via in person. The patient consented to in-person therapy and was in the clinician's office. Limits of confidentiality were discussed with the patient.    Treatment Type: Individual Therapy  Reported Symptoms: Some anxiety and sleep concerns  Mental Status Exam: Appearance:  Well Groomed     Behavior: Appropriate  Motor: Normal  Speech/Language:  Clear and Coherent  Affect: Appropriate  Mood: normal  Thought process: normal  Thought content:   WNL  Sensory/Perceptual disturbances:   WNL  Orientation: oriented to person, place, and time/date  Attention: Good  Concentration: Good  Memory: WNL  Fund of knowledge:  Good  Insight:   Good  Judgment:  Good  Impulse Control: Good   Risk Assessment: Danger to Self:  No Self-injurious Behavior: No Danger to Others: No Duty to Warn:no Physical Aggression / Violence:No  Access to Firearms a concern: No  Gang Involvement:No   Subjective: Beginning the session, patient described himself as doing well. After reviewing the treatment plan, patient disclosed that he has been able to return to his normal level of physical activity via weightlifting and martial arts. He voiced some concern related to whether or not his heart is experiencing damage while working out. After processing these concerns, he then voiced challenges with sleep. He spent time learning about sleep hygiene. He was agreeable to homework and following up. He denied suicidal and homicidal ideation.    Interventions:  Worked on developing a therapeutic relationship with the patient using active listening and reflective statements. Provided emotional support using empathy and validation. Reviewed the treatment plan with the patient. Reviewed events since the intake.  Praised the patient for doing better and returning to doing physical activities. Identified goals for the session. Provided psychoeducation about heart rate zones. Assisted in problem solving. Briefly introduced the idea of defusion. Provided psychoeducation about sleep hygiene including calming relaxing sounds, drinking warm milk, turning off technology 30 minutes before bed, and not using technology in bed. Assisted in problem solving. Assigned homework. Provided empathic statements. Assessed for suicidal and homicidal ideation.   Homework: Implement sleep hygiene tools  Next Session: Review homework and emotional support.   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  Plan:  Goals Work through the grieving process and face reality of own death Accept emotional support from others around them Live life to the fullest, event though time may be limited Become as knowledgeable about the medical condition  Reduce fear, anxiety about the health condition  Accept the illness Accept the role of psychological and behavioral factors  Stabilize anxiety level wile increasing ability to function Learn and implement coping skills that result in a reduction of anxiety  Alleviate depressive symptoms Recognize, accept, and cope with depressive feelings Develop healthy thinking patterns Develop healthy interpersonal relationships  Objectives target date for all objectives is 02/06/2023 Identify feelings associated with the illness Family members share with each other feelings Identify the losses or limitations that have been experienced Verbalize acceptance of the reality of the medical condition Commit to learning and implement a proactive approach to managing personal stresses Verbalize an understanding of the medical condition Work with therapist to develop a plan for coping with stress Learn and implement skills for managing stress Engage in social, productive activities that are  possible Engage in faith based activities  implement positive imagery Identify coping skills and sources of emotional support Patient's partner and family members verbalize their fears regarding severity of health condition Identify sources of emotional distress  Learning and implement calming skills to reduce overall anxiety Learn and implement problem solving strategies Identify and engage in pleasant activities Learning and implement personal and interpersonal skills to reduce anxiety and improve interpersonal relationships Learn to accept limitations in life and commit to tolerating, rather than avoiding, unpleasant emotions while accomplishing meaningful goals Identify major life conflicts from the past and present that form the basis for present anxiety Learn and implement behavioral strategies Verbalize an understanding and resolution of current interpersonal problems Learn and implement problem solving and decision making skills Learn and implement conflict resolution skills to resolve interpersonal problems Verbalize an understanding of healthy and unhealthy emotions verbalize insight into how past relationships may be influence current experiences with depression Use mindfulness and acceptance strategies and increase value based behavior  Increase hopeful statements about the future.   Interventions Teach about stress and ways to handle stress Assist the patient in developing a coping action plan for stressors Conduct skills based training for coping strategies Train problem focused skills Sort out what activities the individual can do Encourage patient to rely upon his/her spiritual faith Teach the patient to use guided imagery Probe and evaluate family's ability to provide emotional support Allow family to share their fears Assist the patient in identifying, sorting through, and verbalizing the various feelings generated by his/her medical condition Meet with family members   Ask patient list out limitations  Use stress inoculation training  Use Acceptance and Commitment Therapy to help client accept uncomfortable realities in order to accomplish value-consistent goals Reinforce the client's insight into the role of his/her past emotional pain and present anxiety  Discuss examples demonstrating that unrealistic worry overestimates the probability of threats and underestimate patient's ability  Assist the patient in analyzing his or her worries Help patient understand that avoidance is reinforcing  Behavioral activation help the client explore the relationship, nature of the dispute,  Help the client develop new interpersonal skills and relationships Conduct Problem so living therapy Teach conflict resolution skills Use a process-experiential approach Conduct TLDP Conduct ACT  The patient and clinician reviewed the treatment plan on 03/02/2022. The patient approved of the treatment plan.    Conception Chancy, PsyD

## 2022-03-02 NOTE — Progress Notes (Signed)
                Toshi Ishii, PsyD 

## 2022-03-13 ENCOUNTER — Encounter: Payer: BC Managed Care – PPO | Admitting: *Deleted

## 2022-03-13 VITALS — BP 111/78 | HR 62 | Temp 99.3°F

## 2022-03-13 DIAGNOSIS — Z006 Encounter for examination for normal comparison and control in clinical research program: Secondary | ICD-10-CM

## 2022-03-13 NOTE — Research (Signed)
Patient seen in clinic today for week 4 visit of [name] trial. Patient denies any adverse events since last visit. Reviewed medications with patient and no changes noted at this visit. Labs were drawn per protocol and patient tolerated well without complaints. Next visit Week 12 scheduled for  05/07/2021 at 8:30am . No IP due at this visit.

## 2022-03-26 NOTE — Research (Addendum)
[name] week 4 lab results:           Are there any labs that are clinically significant?  Yes []  OR No[x]   Minimal CK elevation.  , MD, [facility], FACP  [facility]  [facility]  Medical Director of the Advanced Lipid Disorders &  Cardiovascular Risk Reduction Clinic Diplomate of the American Board of Clinical Lipidology Attending Cardiologist  Direct Dial: [phone]  Fax: [phone]  Website:  [email or web address]

## 2022-04-18 ENCOUNTER — Ambulatory Visit (INDEPENDENT_AMBULATORY_CARE_PROVIDER_SITE_OTHER): Payer: BC Managed Care – PPO | Admitting: Psychologist

## 2022-04-18 DIAGNOSIS — F4521 Hypochondriasis: Secondary | ICD-10-CM

## 2022-04-18 NOTE — Progress Notes (Signed)
[place] Counselor/Therapist Progress Note  Patient ID: Todd [name], MRN: [id number],    Date: 04/18/2022  Time Spent: 10:05 am to 10:35 am; total time: 30 minutes   This session was held via in person. The patient consented to in-person therapy and was in the clinician's office. Limits of confidentiality were discussed with the patient.    Treatment Type: Individual Therapy  Reported Symptoms: Denied concerns  Mental Status Exam: Appearance:  Well Groomed     Behavior: Appropriate  Motor: Normal  Speech/Language:  Clear and Coherent  Affect: Appropriate  Mood: normal  Thought process: normal  Thought content:   WNL  Sensory/Perceptual disturbances:   WNL  Orientation: oriented to person, place, and time/date  Attention: Good  Concentration: Good  Memory: WNL  Fund of knowledge:  Good  Insight:   Good  Judgment:  Good  Impulse Control: Good   Risk Assessment: Danger to Self:  No Self-injurious Behavior: No Danger to Others: No Duty to Warn:no Physical Aggression / Violence:No  Access to Firearms a concern: No  Gang Involvement:No   Subjective: Beginning the session, patient described himself as doing well and denied any concerns. He processed what he has learned about himself from counseling. He reflected on how counseling has assisted him. He processed thoughts and emotions. He denied suicidal and homicidal ideation.    Interventions:  Worked on developing a therapeutic relationship with the patient using active listening and reflective statements. Provided emotional support using empathy and validation. Reviewed the treatment plan with the patient. Used summary and reflective statements. Praised the patient for doing well and explored what has assisted the patient. Processed emotions related to the holidays. Reflected on patient's growth in counseling. Explored what patient learned about self. Praised the patient for the quick response. Provided  empathic statements. Assessed for suicidal and homicidal ideation.   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  Plan:  Goals Work through the grieving process and face reality of own death Accept emotional support from others around them Live life to the fullest, event though time may be limited Become as knowledgeable about the medical condition  Reduce fear, anxiety about the health condition  Accept the illness Accept the role of psychological and behavioral factors  Stabilize anxiety level wile increasing ability to function Learn and implement coping skills that result in a reduction of anxiety  Alleviate depressive symptoms Recognize, accept, and cope with depressive feelings Develop healthy thinking patterns Develop healthy interpersonal relationships  Objectives target date for all objectives is 02/06/2023 Identify feelings associated with the illness Family members share with each other feelings Identify the losses or limitations that have been experienced Verbalize acceptance of the reality of the medical condition Commit to learning and implement a proactive approach to managing personal stresses Verbalize an understanding of the medical condition Work with therapist to develop a plan for coping with stress Learn and implement skills for managing stress Engage in social, productive activities that are possible Engage in faith based activities implement positive imagery Identify coping skills and sources of emotional support Patient's partner and family members verbalize their fears regarding severity of health condition Identify sources of emotional distress  Learning and implement calming skills to reduce overall anxiety Learn and implement problem solving strategies Identify and engage in pleasant activities Learning and implement personal and interpersonal skills to reduce anxiety and improve interpersonal relationships Learn  to accept limitations in life and commit to tolerating, rather than avoiding,  unpleasant emotions while accomplishing meaningful goals Identify major life conflicts from the past and present that form the basis for present anxiety Learn and implement behavioral strategies Verbalize an understanding and resolution of current interpersonal problems Learn and implement problem solving and decision making skills Learn and implement conflict resolution skills to resolve interpersonal problems Verbalize an understanding of healthy and unhealthy emotions verbalize insight into how past relationships may be influence current experiences with depression Use mindfulness and acceptance strategies and increase value based behavior  Increase hopeful statements about the future.   Interventions Teach about stress and ways to handle stress Assist the patient in developing a coping action plan for stressors Conduct skills based training for coping strategies Train problem focused skills Sort out what activities the individual can do Encourage patient to rely upon his/her spiritual faith Teach the patient to use guided imagery Probe and evaluate family's ability to provide emotional support Allow family to share their fears Assist the patient in identifying, sorting through, and verbalizing the various feelings generated by his/her medical condition Meet with family members  Ask patient list out limitations  Use stress inoculation training  Use Acceptance and Commitment Therapy to help client accept uncomfortable realities in order to accomplish value-consistent goals Reinforce the client's insight into the role of his/her past emotional pain and present anxiety  Discuss examples demonstrating that unrealistic worry overestimates the probability of threats and underestimate patient's ability  Assist the patient in analyzing his or her worries Help patient understand that avoidance is reinforcing   Behavioral activation help the client explore the relationship, nature of the dispute,  Help the client develop new interpersonal skills and relationships Conduct Problem so living therapy Teach conflict resolution skills Use a process-experiential approach Conduct TLDP Conduct ACT  The patient and clinician reviewed the treatment plan on 03/02/2022. The patient approved of the treatment plan.    Conception Chancy, PsyD

## 2022-05-07 ENCOUNTER — Encounter: Payer: BC Managed Care – PPO | Admitting: *Deleted

## 2022-05-07 VITALS — BP 101/63 | HR 54 | Temp 98.0°F | Resp 16

## 2022-05-07 DIAGNOSIS — Z006 Encounter for examination for normal comparison and control in clinical research program: Secondary | ICD-10-CM

## 2022-05-07 MED ORDER — STUDY - OCEAN(A) - OLPASIRAN (AMG 890) 142 MG/ML OR PLACEBO SQ INJECTION (PI-HILTY)
142.0000 mg | PREFILLED_SYRINGE | Freq: Once | SUBCUTANEOUS | Status: AC
  Administered 2022-05-07: 142 mg via SUBCUTANEOUS
  Filled 2022-05-07: qty 1

## 2022-05-07 NOTE — Research (Addendum)
[name] IP ADMINISTRATION Subject ZH#[id number] IP Box [id number] IP XBM#:[id number] Time Given:09:15 Administration site:Right arm Patient was monitored for 30 minutes after IP injection.    Current Outpatient Medications:    acetaminophen (TYLENOL) 325 MG tablet, Take 2 tablets (650 mg total) by mouth every 4 (four) hours as needed for headache or mild pain., Disp: , Rfl:    aspirin 81 MG chewable tablet, Chew 1 tablet (81 mg total) by mouth daily., Disp: , Rfl:    atorvastatin (LIPITOR) 80 MG tablet, Take 1 tablet (80 mg total) by mouth daily. (Patient taking differently: Take 80 mg by mouth at bedtime.), Disp: 30 tablet, Rfl: 6   B Complex Vitamins (B-COMPLEX/B-12 PO), Take 1 tablet by mouth daily., Disp: , Rfl:    Cholecalciferol (D3 5000 PO), Take 1 tablet by mouth daily., Disp: , Rfl:    levothyroxine (SYNTHROID) 112 MCG tablet, Take 112 mcg by mouth daily., Disp: , Rfl:    MAGNESIUM PO, Take 1 tablet by mouth daily., Disp: , Rfl:    metFORMIN (GLUCOPHAGE) 500 MG tablet, Take 500 mg by mouth daily., Disp: , Rfl:    metoprolol succinate (TOPROL XL) 25 MG 24 hr tablet, Take 1 tablet (25 mg total) by mouth daily., Disp: 90 tablet, Rfl: 3   Niacinamide-Zn-Cu-Methfo-Se-Cr (NICOTINAMIDE PO), Take 1 capsule by mouth daily., Disp: , Rfl:    nitroGLYCERIN (NITROSTAT) 0.4 MG SL tablet, Place 1 tablet (0.4 mg total) under the tongue every 5 (five) minutes as needed for chest pain., Disp: 25 tablet, Rfl: 3   Omega-3 Fatty Acids (OMEGA-3 PLUS PO), Take 1 capsule by mouth daily., Disp: , Rfl:    Study - [name] - olpasiran (AMG 890) 142 mg/mL or placebo SQ injection (PI-Hilty), Inject 142 mg into the skin once. For Investigational Use Only. Inject 1 mL (1 prefilled syringe) subcutaneously into appropriate injection site per protocol every 12 weeks. (Approved injection site(s): upper arm, upper thigh & abdomen). Please reach out to [facility] for any questions or concerns regarding  this medication., Disp: , Rfl:    ticagrelor (BRILINTA) 90 MG TABS tablet, Take 1 tablet (90 mg total) by mouth 2 (two) times daily., Disp: 60 tablet, Rfl: 11   zinc gluconate 50 MG tablet, Take 50 mg by mouth daily., Disp: , Rfl:    Multiple Vitamin (MULTIVITAMIN) tablet, Take 1 tablet by mouth daily., Disp: , Rfl:

## 2022-05-07 NOTE — Research (Signed)
[name] week 12 visit:   Patient seen in clinic today for week  12 visit of [name] trial. Patient denies any adverse events since last visit. Reviewed medications with patient and no changes noted at this visit. All procedures and  Lab work completed per protocol and patient tolerated well without complaints. IP injection was given at 09:15 in the right arm without any complications noted.  Next visit Week 24 scheduled for 07/30/2022.   PK drawn at 10:15.      Dr.Hilty:  Subject did say that he has been dying his beard every week with "[employer]" for several years and ever since he got his first injection, he has had a rash and itching under his beard. Facial area was red today under his beard.  He stated he wasn't sure if it was due to the injection or if it is something else. He also stated that he dyes his beard every Sunday and the itching/redness lasts till Friday, he is ok on Saturday and then it returns on Sunday after he dyes his beard again. No difficulty breathing or any other reactions noted. He has not changed any soaps, laundry detergents, medications, foods,etc.   Would you consider this an AE?

## 2022-05-09 NOTE — Research (Addendum)
[name] week 12 lab results:        Are there any labs that are clinically significant?  Yes [] OR No[x]  Kenneth C. Hilty, MD, FACC, FACP  [place]  [facility]  Medical Director of the Advanced Lipid Disorders &  Cardiovascular Risk Reduction Clinic Diplomate of the American Board of Clinical Lipidology Attending Cardiologist  Direct Dial: [phone]  Fax: [phone]  Website:  [email or web address]  

## 2022-05-17 DIAGNOSIS — H10013 Acute follicular conjunctivitis, bilateral: Secondary | ICD-10-CM | POA: Diagnosis not present

## 2022-05-18 ENCOUNTER — Ambulatory Visit: Payer: BC Managed Care – PPO | Attending: Cardiovascular Disease

## 2022-05-18 DIAGNOSIS — E782 Mixed hyperlipidemia: Secondary | ICD-10-CM | POA: Diagnosis not present

## 2022-05-18 DIAGNOSIS — I2510 Atherosclerotic heart disease of native coronary artery without angina pectoris: Secondary | ICD-10-CM | POA: Diagnosis not present

## 2022-05-18 LAB — HEPATIC FUNCTION PANEL
ALT: 19 IU/L (ref 0–44)
AST: 19 IU/L (ref 0–40)
Albumin: 4.2 g/dL (ref 4.1–5.1)
Alkaline Phosphatase: 50 IU/L (ref 44–121)
Bilirubin Total: 0.5 mg/dL (ref 0.0–1.2)
Bilirubin, Direct: 0.1 mg/dL (ref 0.00–0.40)
Total Protein: 6.4 g/dL (ref 6.0–8.5)

## 2022-05-18 LAB — LIPID PANEL
Chol/HDL Ratio: 3.7 ratio (ref 0.0–5.0)
Cholesterol, Total: 146 mg/dL (ref 100–199)
HDL: 39 mg/dL — ABNORMAL LOW (ref >39)
LDL Chol Calc (NIH): 81 mg/dL (ref 0–99)
Triglycerides: 146 mg/dL (ref 0–149)
VLDL Cholesterol Cal: 26 mg/dL (ref 5–40)

## 2022-05-22 ENCOUNTER — Other Ambulatory Visit: Payer: Self-pay | Admitting: Cardiology

## 2022-05-22 NOTE — Research (Addendum)
[name] lab results:              Are there any labs that are clinically significant?  Yes []  OR No[x]   Pixie Casino, MD, FACC, [place] Director of the Advanced Lipid Disorders &  Cardiovascular Risk Reduction Clinic Diplomate of the American Board of Clinical Lipidology Attending Cardiologist  Direct Dial: [phone]  Fax: [phone]  Website:  [email or web address]

## 2022-06-28 ENCOUNTER — Encounter: Payer: Self-pay | Admitting: Cardiovascular Disease

## 2022-06-28 MED ORDER — TICAGRELOR 90 MG PO TABS: 90.0000 mg | ORAL_TABLET | Freq: Two times a day (BID) | ORAL | 0 refills | Status: DC

## 2022-07-03 DIAGNOSIS — L218 Other seborrheic dermatitis: Secondary | ICD-10-CM | POA: Diagnosis not present

## 2022-07-30 ENCOUNTER — Encounter: Payer: BC Managed Care – PPO | Admitting: *Deleted

## 2022-07-30 VITALS — BP 128/76 | HR 59 | Temp 98.8°F | Wt 275.4 lb

## 2022-07-30 DIAGNOSIS — Z006 Encounter for examination for normal comparison and control in clinical research program: Secondary | ICD-10-CM

## 2022-07-30 MED ORDER — STUDY - OCEAN(A) - OLPASIRAN (AMG 890) 142 MG/ML OR PLACEBO SQ INJECTION (PI-HILTY)
142.0000 mg | PREFILLED_SYRINGE | Freq: Once | SUBCUTANEOUS | Status: AC
  Administered 2022-07-30: 142 mg via SUBCUTANEOUS
  Filled 2022-07-30: qty 1

## 2022-07-30 NOTE — Research (Addendum)
[name] Week 24  Patient seen in clinic today for week  24 visit of [name] trial. Patient denies any adverse events since last visit. Reviewed medications with patient and no changes noted at this visit. All procedures and  Lab work completed per protocol and patient tolerated well without complaints. IP injection was given at 8:57 in the R arm without any complications noted.  Next visit Week 36 scheduled for 11/05/2022.  Pt was monitored for 30 minutes after IP injection.  PK drawn at 10:07.   Reviewed lipid blinding with the patient and he understands.      [name] IP ADMINISTRATION Subject ZO#[id number] IP Box [id number] IP JXB#:[id number] Time Given:08:57 Administration site: R arm    Current Outpatient Medications:    acetaminophen (TYLENOL) 325 MG tablet, Take 2 tablets (650 mg total) by mouth every 4 (four) hours as needed for headache or mild pain., Disp: , Rfl:    aspirin 81 MG chewable tablet, Chew 1 tablet (81 mg total) by mouth daily., Disp: , Rfl:    atorvastatin (LIPITOR) 80 MG tablet, TAKE 1 TABLET BY MOUTH EVERY DAY, Disp: 90 tablet, Rfl: 3   B Complex Vitamins (B-COMPLEX/B-12 PO), Take 1 tablet by mouth daily., Disp: , Rfl:    Cholecalciferol (D3 5000 PO), Take 1 tablet by mouth daily., Disp: , Rfl:    levothyroxine (SYNTHROID) 112 MCG tablet, Take 112 mcg by mouth daily., Disp: , Rfl:    MAGNESIUM PO, Take 1 tablet by mouth daily., Disp: , Rfl:    metFORMIN (GLUCOPHAGE) 500 MG tablet, Take 500 mg by mouth daily., Disp: , Rfl:    metoprolol succinate (TOPROL XL) 25 MG 24 hr tablet, Take 1 tablet (25 mg total) by mouth daily., Disp: 90 tablet, Rfl: 3   Multiple Vitamin (MULTIVITAMIN) tablet, Take 1 tablet by mouth daily., Disp: , Rfl:    Niacinamide-Zn-Cu-Methfo-Se-Cr (NICOTINAMIDE PO), Take 1 capsule by mouth daily., Disp: , Rfl:    nitroGLYCERIN (NITROSTAT) 0.4 MG SL tablet, Place 1 tablet (0.4 mg total) under the tongue every 5 (five) minutes as needed for chest pain.,  Disp: 25 tablet, Rfl: 3   Omega-3 Fatty Acids (OMEGA-3 PLUS PO), Take 1 capsule by mouth daily., Disp: , Rfl:    Study - [name] - olpasiran (AMG 890) 142 mg/mL or placebo SQ injection (PI-Hilty), Inject 142 mg into the skin once. For Investigational Use Only. Inject 1 mL (1 prefilled syringe) subcutaneously into appropriate injection site per protocol every 12 weeks. (Approved injection site(s): upper arm, upper thigh & abdomen). Please reach out to [facility] Research for any questions or concerns regarding this medication., Disp: , Rfl:    ticagrelor (BRILINTA) 90 MG TABS tablet, Take 1 tablet (90 mg total) by mouth 2 (two) times daily., Disp: 60 tablet, Rfl: 11   zinc gluconate 50 MG tablet, Take 50 mg by mouth daily., Disp: , Rfl:    ticagrelor (BRILINTA) 90 MG TABS tablet, Take 1 tablet (90 mg total) by mouth 2 (two) times daily., Disp: 32 tablet, Rfl: 0

## 2022-07-30 NOTE — Progress Notes (Signed)
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  His initial Lp(a) was well over 200.  He is doing well, but has had issues with erectile dysfunction and asked about his meds.  I shared with him the REDUCE-MI recently presented, and suggested he discuss this with Dr. Clifton James.  He seems to be tolerating IP without difficulty.  He also asked about going on to testosterone, and also about the possibility of weight loss peptides.    Alert, oriented male in NAD BP128/76 P59 R16 unlabored T98.8 Lungs clear Cor reg Abdomen -soft Ext  no edema   ECG -  SB.  Low voltage QRS.  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   Erectile dysfunction   Recommend   Continued enrolloment in [place] a trial  RTC per protocol  Recommend patient discuss with Dr. Clifton James possible medication alterations during May 6 office visit.    Arturo Morton. Riley Kill, MD, [facility] Medical Director, [facility]

## 2022-08-07 NOTE — Research (Addendum)
[name] week 24 lab results:           Are there any labs that are clinically significant?  Yes [] OR No[] 

## 2022-08-20 ENCOUNTER — Ambulatory Visit: Payer: BC Managed Care – PPO | Attending: Cardiovascular Disease | Admitting: Cardiovascular Disease

## 2022-08-20 ENCOUNTER — Encounter: Payer: Self-pay | Admitting: Cardiovascular Disease

## 2022-08-20 VITALS — BP 110/70 | HR 60 | Ht 71.0 in | Wt 276.0 lb

## 2022-08-20 DIAGNOSIS — I2510 Atherosclerotic heart disease of native coronary artery without angina pectoris: Secondary | ICD-10-CM | POA: Diagnosis not present

## 2022-08-20 DIAGNOSIS — I10 Essential (primary) hypertension: Secondary | ICD-10-CM | POA: Diagnosis not present

## 2022-08-20 DIAGNOSIS — E782 Mixed hyperlipidemia: Secondary | ICD-10-CM | POA: Diagnosis not present

## 2022-08-20 NOTE — Patient Instructions (Signed)
Medication Instructions:  Your physician has recommended you make the following change in your medication:  1.) stop metoprolol  *If you need a refill on your cardiac medications before your next appointment, please call your pharmacy*   Lab Work: none If you have labs (blood work) drawn today and your tests are completely normal, you will receive your results only by: MyChart Message (if you have MyChart) OR A paper copy in the mail If you have any lab test that is abnormal or we need to change your treatment, we will call you to review the results.   Testing/Procedures: none   Follow-Up: 6 months - see below

## 2022-08-20 NOTE — Progress Notes (Signed)
Chief Complaint  Patient presents with   Follow-up    CAD   History of Present Illness: 47 yo male with history of CAD, hyperlipidemia. HTN, hypothyoridism and VT who is here today for cardiac follow up. He was admitted to [facility] with a lateral STEMI in September 2023. He presented to [facility] ED with chest pain and had a cardiac arrest. He was transferred to [facility] for emergent cardiac cath which showed an occluded proximal Circumflex. Two drug eluting stents were placed. OM2 was treated with balloon angioplasty. Mild disease in the LAD and RCA. Echo 12/22/21 with LVEF=50-55%. No valve disease.   He is here today for follow up. The patient denies any chest pain, dyspnea, palpitations, lower extremity edema, orthopnea, PND, dizziness, near syncope or syncope. He is very active. He is back to exercising. He does notice that he can not do as much cardio as before his MI. Libido is still an issue on Toprol.   Primary Care Physician: Vivien Presto, MD   Past Medical History:  Diagnosis Date   CAD (coronary artery disease)    Hyperlipidemia    NSVT (nonsustained ventricular tachycardia) (HCC) 12/23/2021   Thyroid disease     Past Surgical History:  Procedure Laterality Date   CORONARY/GRAFT ACUTE MI REVASCULARIZATION N/A 12/21/2021   Procedure: Coronary/Graft Acute MI Revascularization;  Surgeon: Kathleene Hazel, MD;  Location: [facility] INVASIVE CV LAB;  Service: Cardiovascular;  Laterality: N/A;   EYE SURGERY     LEFT HEART CATH AND CORONARY ANGIOGRAPHY N/A 12/21/2021   Procedure: LEFT HEART CATH AND CORONARY ANGIOGRAPHY;  Surgeon: Kathleene Hazel, MD;  Location: [facility] INVASIVE CV LAB;  Service: Cardiovascular;  Laterality: N/A;    Current Outpatient Medications  Medication Sig Dispense Refill   acetaminophen (TYLENOL) 325 MG tablet Take 2 tablets (650 mg total) by mouth every 4 (four) hours as needed for headache or mild pain.     aspirin 81 MG chewable tablet Chew 1 tablet (81  mg total) by mouth daily.     atorvastatin (LIPITOR) 80 MG tablet TAKE 1 TABLET BY MOUTH EVERY DAY 90 tablet 3   B Complex Vitamins (B-COMPLEX/B-12 PO) Take 1 tablet by mouth daily.     Cholecalciferol (D3 5000 PO) Take 1 tablet by mouth daily.     levothyroxine (SYNTHROID) 112 MCG tablet Take 112 mcg by mouth daily.     MAGNESIUM PO Take 1 tablet by mouth daily.     metFORMIN (GLUCOPHAGE) 500 MG tablet Take 500 mg by mouth daily.     Multiple Vitamin (MULTIVITAMIN) tablet Take 1 tablet by mouth daily.     Niacinamide-Zn-Cu-Methfo-Se-Cr (NICOTINAMIDE PO) Take 1 capsule by mouth daily.     nitroGLYCERIN (NITROSTAT) 0.4 MG SL tablet Place 1 tablet (0.4 mg total) under the tongue every 5 (five) minutes as needed for chest pain. 25 tablet 3   Omega-3 Fatty Acids (OMEGA-3 PLUS PO) Take 1 capsule by mouth daily.     Study - [name] - olpasiran (AMG 890) 142 mg/mL or placebo SQ injection (PI-Hilty) Inject 142 mg into the skin once. For Investigational Use Only. Inject 1 mL (1 prefilled syringe) subcutaneously into appropriate injection site per protocol every 12 weeks. (Approved injection site(s): upper arm, upper thigh & abdomen). Please reach out to [facility] Research for any questions or concerns regarding this medication.     ticagrelor (BRILINTA) 90 MG TABS tablet Take 1 tablet (90 mg total) by mouth 2 (two) times daily. 60  tablet 11   zinc gluconate 50 MG tablet Take 50 mg by mouth daily.     No current facility-administered medications for this visit.    No Known Allergies  Social History   Socioeconomic History   Marital status: Married    Spouse name: Not on file   Number of children: Not on file   Years of education: Not on file   Highest education level: Not on file  Occupational History   Not on file  Tobacco Use   Smoking status: Never   Smokeless tobacco: Not on file  Substance and Sexual Activity   Alcohol use: No   Drug use: No   Sexual activity: Not on file   Other Topics Concern   Not on file  Social History Narrative   Not on file   Social Determinants of Health   Financial Resource Strain: Not on file  Food Insecurity: No Food Insecurity (12/29/2021)   Hunger Vital Sign    Worried About Running Out of Food in the Last Year: Never true    Ran Out of Food in the Last Year: Never true  Transportation Needs: No Transportation Needs (12/29/2021)   PRAPARE - Administrator, Civil Service (Medical): No    Lack of Transportation (Non-Medical): No  Physical Activity: Not on file  Stress: Not on file  Social Connections: Not on file  Intimate Partner Violence: Not At Risk (12/29/2021)   Humiliation, Afraid, Rape, and Kick questionnaire    Fear of Current or Ex-Partner: No    Emotionally Abused: No    Physically Abused: No    Sexually Abused: No    Family History  Problem Relation Age of Onset   Heart attack Mother 62    Review of Systems:  As stated in the HPI and otherwise negative.   BP 110/70   Pulse 60   Ht 5\' 11"  (1.803 m)   Wt 125.2 kg   SpO2 96%   BMI 38.49 kg/m   Physical Examination: General: Well developed, well nourished, NAD  HEENT: OP clear, mucus membranes moist  SKIN: warm, dry. No rashes. Neuro: No focal deficits  Musculoskeletal: Muscle strength 5/5 all ext  Psychiatric: Mood and affect normal  Neck: No JVD, no carotid bruits, no thyromegaly, no lymphadenopathy.  Lungs:Clear bilaterally, no wheezes, rhonci, crackles Cardiovascular: Regular rate and rhythm. No murmurs, gallops or rubs. Abdomen:Soft. Bowel sounds present. Non-tender.  Extremities: No lower extremity edema. Pulses are 2 + in the bilateral DP/PT.  EKG:  EKG is not ordered today. The ekg ordered today demonstrates   Echo 12/22/21:  1. Hypokinesis of the inferolateral wall with overall low normal LV  function.   2. Left ventricular ejection fraction, by estimation, is 50 to 55%. The  left ventricle has low normal function. The left  ventricle demonstrates  regional wall motion abnormalities (see scoring diagram/findings for  description). Left ventricular diastolic   parameters are consistent with Grade I diastolic dysfunction (impaired  relaxation).   3. Right ventricular systolic function is normal. The right ventricular  size is normal.   4. The mitral valve is normal in structure. Trivial mitral valve  regurgitation. No evidence of mitral stenosis.   5. The aortic valve is tricuspid. Aortic valve regurgitation is not  visualized. Aortic valve sclerosis is present, with no evidence of aortic  valve stenosis.   6. The inferior vena cava is normal in size with greater than 50%  respiratory variability, suggesting right atrial pressure  of 3 mmHg.    Recent Labs: 12/21/2021: Magnesium 2.1 12/22/2021: TSH 3.368 01/09/2022: BUN 16; Creatinine, Ser 1.08; Hemoglobin 16.8; Platelets 202; Potassium 3.8; Sodium 138 05/18/2022: ALT 19   Lipid Panel    Component Value Date/Time   CHOL 146 05/18/2022 0810   TRIG 146 05/18/2022 0810   HDL 39 (L) 05/18/2022 0810   CHOLHDL 3.7 05/18/2022 0810   CHOLHDL 5.3 12/21/2021 1159   VLDL 25 12/21/2021 1159   LDLCALC 81 05/18/2022 0810     Wt Readings from Last 3 Encounters:  08/20/22 125.2 kg  07/30/22 124.9 kg  02/19/22 113.9 kg    Assessment and Plan:   1. CAD without angina: No chest pain. Will continue DAPT with ASA/Brilinta through September 2024 and then he can stop the Brilinta. Continue statin. Will stop his beta blocker given his issues with libido and exercise intolerance.   2. HTN: BP is well controlled. Since we are stopping Toprol, he will follow his BP at home.   3. Hyperlipidemia: LDL near goal in February 8413. Continue statin.   Labs/ tests ordered today include:   No orders of the defined types were placed in this encounter.  Disposition:   F/U with me in 6 months  Signed, Verne Carrow, MD, [facility] 08/20/2022 8:44 AM    [facility]  [facility] [address] [place], [place], [place]  [address] Phone: [phone]; Fax: [phone]

## 2022-11-05 DIAGNOSIS — Z006 Encounter for examination for normal comparison and control in clinical research program: Secondary | ICD-10-CM

## 2022-11-05 MED ORDER — STUDY - OCEAN(A) - OLPASIRAN (AMG 890) 142 MG/ML OR PLACEBO SQ INJECTION (PI-HILTY)
142.0000 mg | PREFILLED_SYRINGE | Freq: Once | SUBCUTANEOUS | Status: AC
  Administered 2022-11-05: 142 mg via SUBCUTANEOUS
  Filled 2022-11-05: qty 1

## 2022-11-05 MED ORDER — STUDY - OCEAN(A) - OLPASIRAN (AMG 890) 142 MG/ML OR PLACEBO SQ INJECTION (PI-HILTY): 142.0000 mg | PREFILLED_SYRINGE | Freq: Once | SUBCUTANEOUS | 0 refills | Status: AC

## 2022-11-05 NOTE — Research (Signed)
[name] week 36  Patient seen in clinic today for week  36 visit of [name] trial. Patient denies any adverse events since last visit. Reviewed medications with patient and no changes noted at this visit. All procedures and  Lab work completed per protocol and patient tolerated well without complaints. IP injection was given at 09:13 in the R arm without any complications noted.  Next visit Week 48 scheduled for 12/19/2022.  Reviewed lipid blinding with the patient and he/she understands.   [name] IP ADMINISTRATION Subject NW#[id number] IP Box [id number] IP HQI#:[id number] Time Given:09:13 Administration site:R arm   Current Outpatient Medications:    acetaminophen (TYLENOL) 325 MG tablet, Take 2 tablets (650 mg total) by mouth every 4 (four) hours as needed for headache or mild pain., Disp: , Rfl:    aspirin 81 MG chewable tablet, Chew 1 tablet (81 mg total) by mouth daily., Disp: , Rfl:    atorvastatin (LIPITOR) 80 MG tablet, TAKE 1 TABLET BY MOUTH EVERY DAY, Disp: 90 tablet, Rfl: 3   B Complex Vitamins (B-COMPLEX/B-12 PO), Take 1 tablet by mouth daily., Disp: , Rfl:    Cholecalciferol (D3 5000 PO), Take 1 tablet by mouth daily., Disp: , Rfl:    levothyroxine (SYNTHROID) 112 MCG tablet, Take 112 mcg by mouth daily., Disp: , Rfl:    MAGNESIUM PO, Take 1 tablet by mouth daily., Disp: , Rfl:    metFORMIN (GLUCOPHAGE) 500 MG tablet, Take 500 mg by mouth daily., Disp: , Rfl:    Multiple Vitamin (MULTIVITAMIN) tablet, Take 1 tablet by mouth daily., Disp: , Rfl:    Niacinamide-Zn-Cu-Methfo-Se-Cr (NICOTINAMIDE PO), Take 1 capsule by mouth daily., Disp: , Rfl:    nitroGLYCERIN (NITROSTAT) 0.4 MG SL tablet, Place 1 tablet (0.4 mg total) under the tongue every 5 (five) minutes as needed for chest pain., Disp: 25 tablet, Rfl: 3   Omega-3 Fatty Acids (OMEGA-3 PLUS PO), Take 1 capsule by mouth daily., Disp: , Rfl:    Study - [name] - olpasiran (AMG 890) 142 mg/mL or placebo SQ injection  (PI-Hilty), Inject 1 mL (142 mg total) into the skin once for 1 dose. For Investigational Use Only. Inject 1 mL (1 prefilled syringe) subcutaneously into appropriate injection site per protocol every 12 weeks. (Approved injection site(s): upper arm, upper thigh & abdomen). Please reach out to [facility] Research for any questions or concerns regarding this medication., Disp: 1 mL, Rfl: 0   ticagrelor (BRILINTA) 90 MG TABS tablet, Take 1 tablet (90 mg total) by mouth 2 (two) times daily., Disp: 60 tablet, Rfl: 11   zinc gluconate 50 MG tablet, Take 50 mg by mouth daily., Disp: , Rfl:   Current Facility-Administered Medications:    Study - [name] - olpasiran (AMG 890) 142 mg/mL or placebo SQ injection (PI-Hilty), 142 mg, Subcutaneous, Once,

## 2022-11-08 NOTE — Research (Addendum)
[name] week 36 lab          Are there any labs that are clinically significant?  Yes []  OR No[x]   Chrystie Nose, MD, [facility]  [place]  [facility]  Medical Director of the [facility]  [facility] Diplomate of the American Board of Clinical Lipidology Attending Cardiologist  Direct Dial: [phone]  Fax: [phone]  Website:  [email or web address]

## 2022-11-19 ENCOUNTER — Other Ambulatory Visit: Payer: Self-pay | Admitting: Cardiology

## 2022-12-03 DIAGNOSIS — Z125 Encounter for screening for malignant neoplasm of prostate: Secondary | ICD-10-CM | POA: Diagnosis not present

## 2022-12-03 DIAGNOSIS — E039 Hypothyroidism, unspecified: Secondary | ICD-10-CM | POA: Diagnosis not present

## 2022-12-03 DIAGNOSIS — Z0000 Encounter for general adult medical examination without abnormal findings: Secondary | ICD-10-CM | POA: Diagnosis not present

## 2022-12-03 DIAGNOSIS — E782 Mixed hyperlipidemia: Secondary | ICD-10-CM | POA: Diagnosis not present

## 2022-12-04 DIAGNOSIS — Z20822 Contact with and (suspected) exposure to covid-19: Secondary | ICD-10-CM | POA: Diagnosis not present

## 2022-12-04 DIAGNOSIS — E291 Testicular hypofunction: Secondary | ICD-10-CM | POA: Diagnosis not present

## 2022-12-04 DIAGNOSIS — Z131 Encounter for screening for diabetes mellitus: Secondary | ICD-10-CM | POA: Diagnosis not present

## 2022-12-04 DIAGNOSIS — Z0000 Encounter for general adult medical examination without abnormal findings: Secondary | ICD-10-CM | POA: Diagnosis not present

## 2022-12-04 DIAGNOSIS — E039 Hypothyroidism, unspecified: Secondary | ICD-10-CM | POA: Diagnosis not present

## 2022-12-04 DIAGNOSIS — E782 Mixed hyperlipidemia: Secondary | ICD-10-CM | POA: Diagnosis not present

## 2022-12-04 DIAGNOSIS — Z1330 Encounter for screening examination for mental health and behavioral disorders, unspecified: Secondary | ICD-10-CM | POA: Diagnosis not present

## 2022-12-04 DIAGNOSIS — R6889 Other general symptoms and signs: Secondary | ICD-10-CM | POA: Diagnosis not present

## 2022-12-19 ENCOUNTER — Encounter: Payer: BC Managed Care – PPO | Admitting: *Deleted

## 2022-12-19 DIAGNOSIS — Z79890 Hormone replacement therapy (postmenopausal): Secondary | ICD-10-CM | POA: Diagnosis not present

## 2022-12-19 DIAGNOSIS — Z006 Encounter for examination for normal comparison and control in clinical research program: Secondary | ICD-10-CM

## 2022-12-19 DIAGNOSIS — D751 Secondary polycythemia: Secondary | ICD-10-CM | POA: Diagnosis not present

## 2022-12-19 DIAGNOSIS — R718 Other abnormality of red blood cells: Secondary | ICD-10-CM | POA: Diagnosis not present

## 2022-12-19 IMAGING — CT CT ABD-PELV W/ CM
2 of 5 series · 16 of 46 positions shown, 18 images · IV contrast (APPLIED)
Comparison: None.

CLINICAL DATA: Left lower quadrant pain

EXAM:
CT ABDOMEN AND PELVIS WITH CONTRAST
TECHNIQUE: Multidetector CT imaging of the abdomen and pelvis was performed
using the standard protocol following bolus administration of
intravenous contrast.
CONTRAST:  80mL OMNIPAQUE IOHEXOL 350 MG/ML SOLN

[Series 2: abd pel w · axial · 0.85mm/px · z∈[+945,+1410]mm · 13 of 105 slices shown, 15 images]
[im 6/105  soft-tissue]
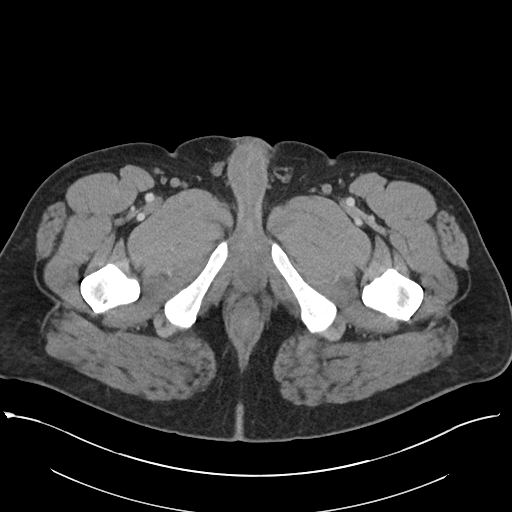
[im 6/105  bone]
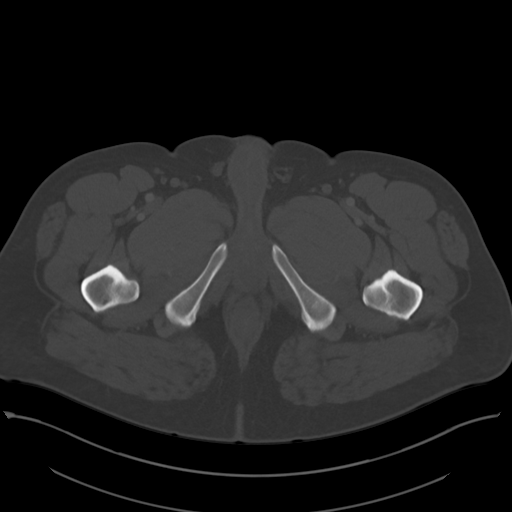
[im 17/105  soft-tissue]
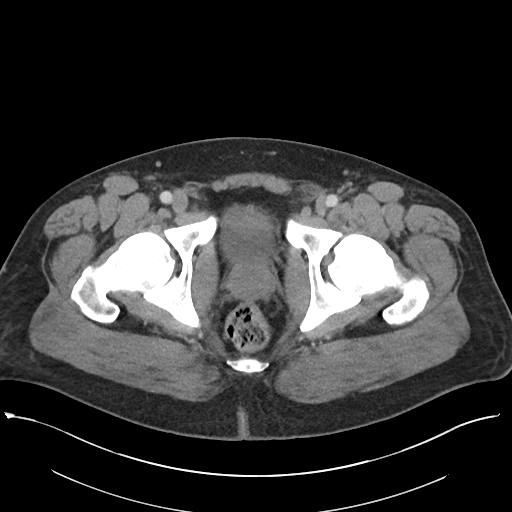
[im 22/105  soft-tissue]
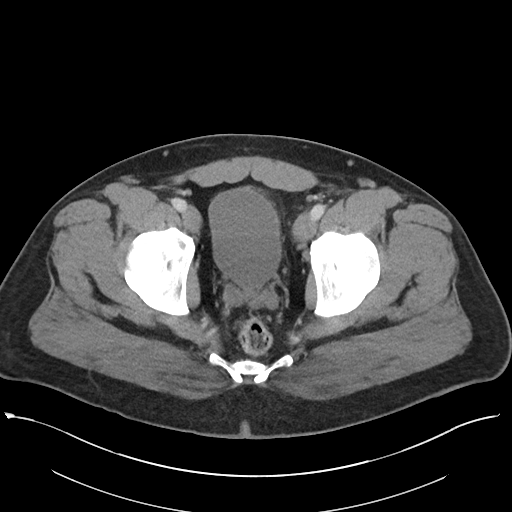
[im 28/105  soft-tissue]
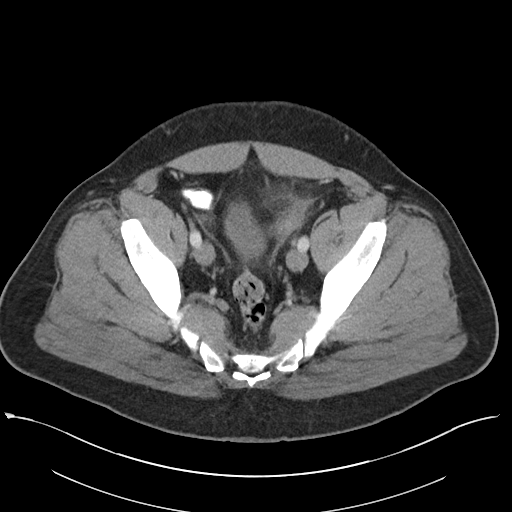
[im 39/105  soft-tissue]
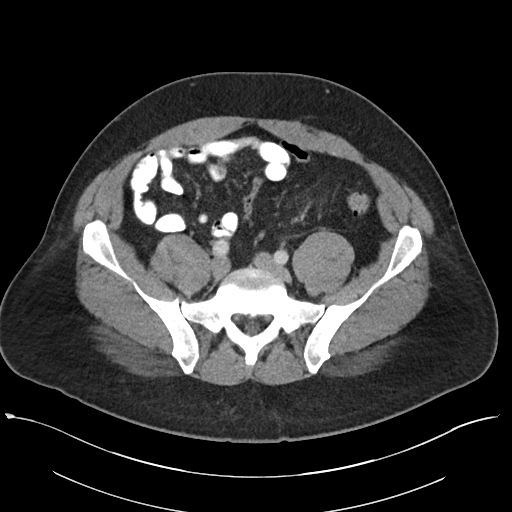
[im 44/105  soft-tissue]
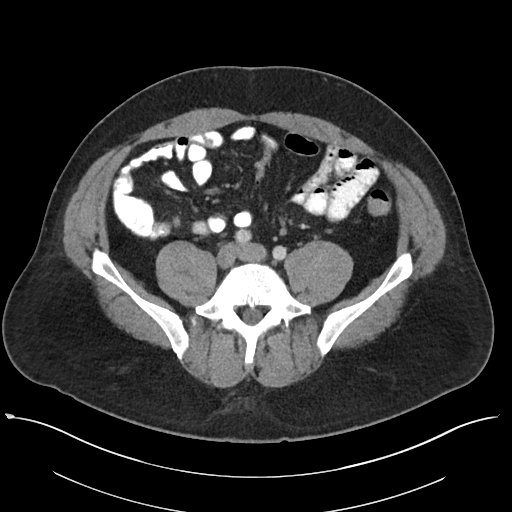
[im 55/105  soft-tissue]
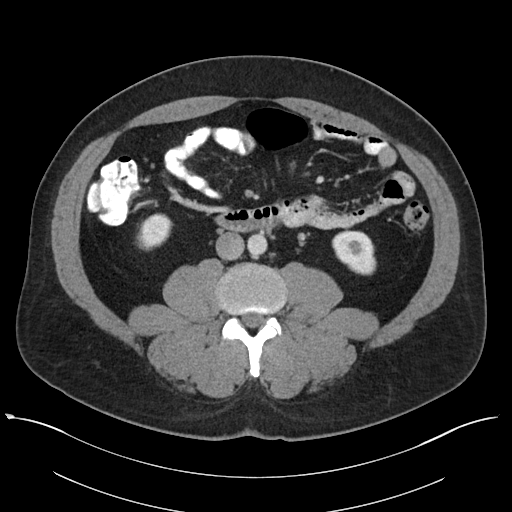
[im 61/105  soft-tissue]
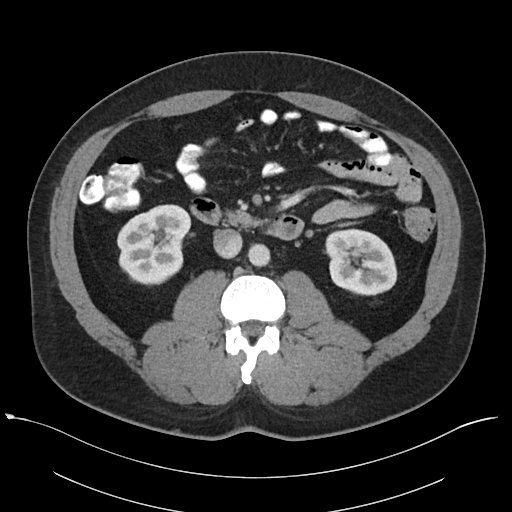
[im 66/105  soft-tissue]
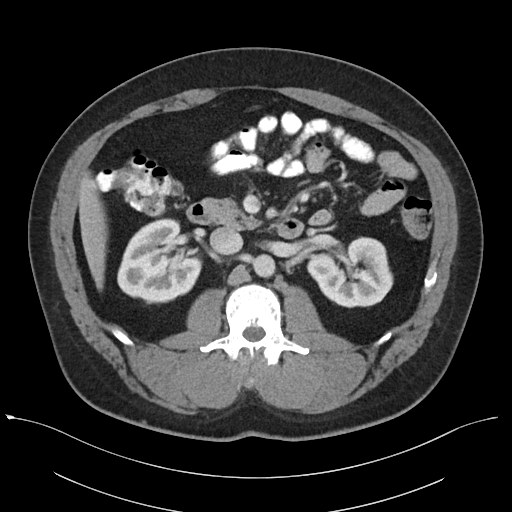
[im 66/105  bone]
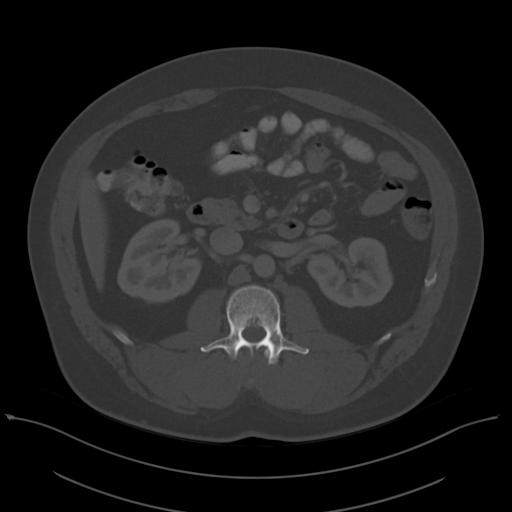
[im 77/105  soft-tissue]
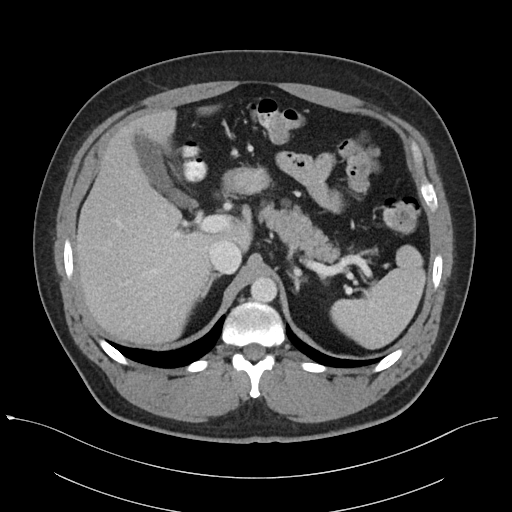
[im 83/105  soft-tissue]
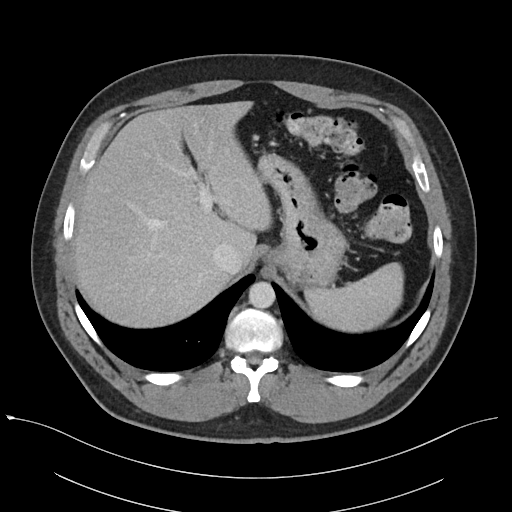
[im 88/105  soft-tissue]
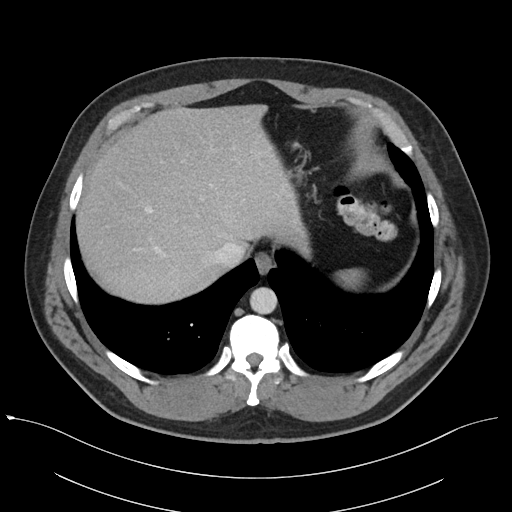
[im 99/105  soft-tissue]
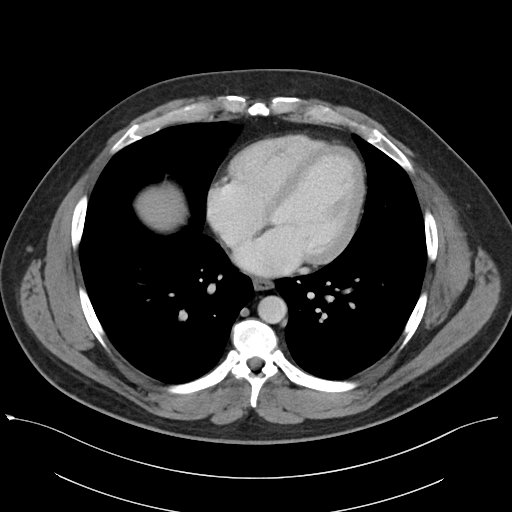

[Series 5: coronal · coronal · 0.91mm/px · 3 of 119 slices shown]
[im 40/119  soft-tissue]
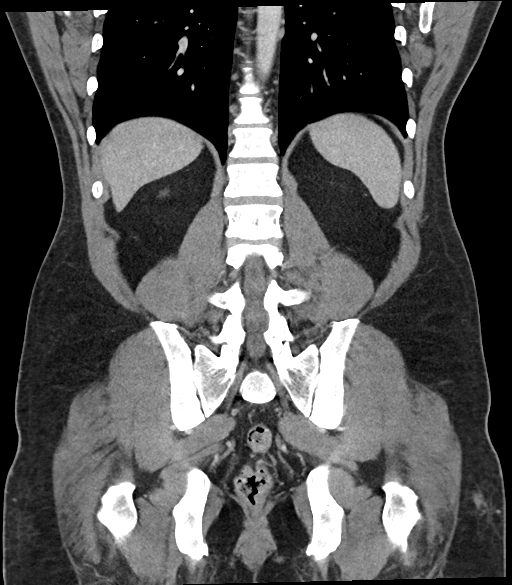
[im 53/119  soft-tissue]
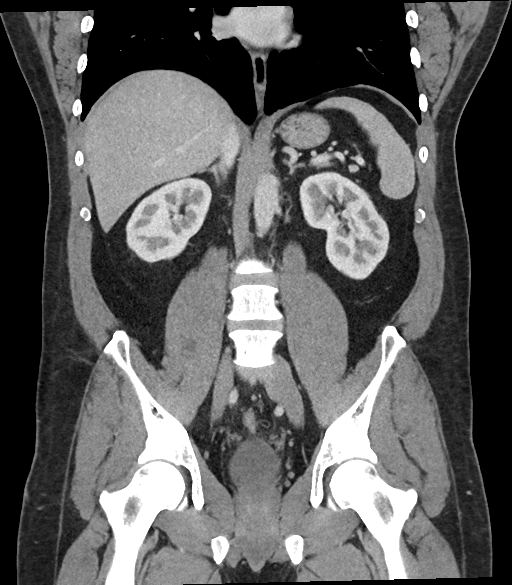
[im 66/119  soft-tissue]
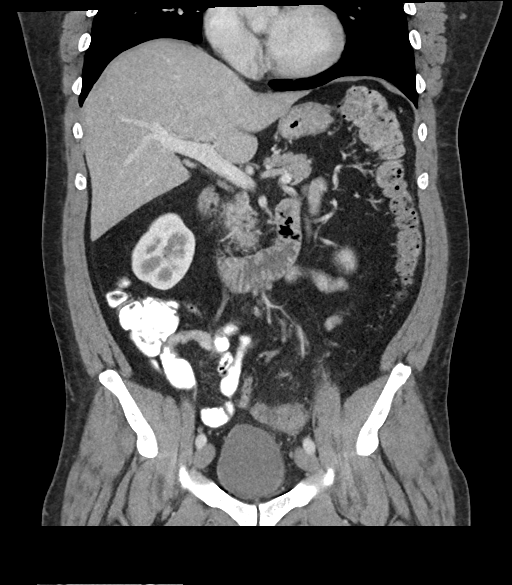

[16 of 46 positions shown; findings below may reference images not displayed]

FINDINGS: Lower chest: Lung bases are clear. No effusions. Heart is normal
size.

Hepatobiliary: No focal hepatic abnormality. Gallbladder
unremarkable.

Pancreas: No focal abnormality or ductal dilatation.

Spleen: Calcifications throughout the spleen compatible with old
granulomatous disease. Normal size.

Adrenals/Urinary Tract: No adrenal abnormality. No focal renal
abnormality. No stones or hydronephrosis. Urinary bladder is
unremarkable.

Stomach/Bowel: Sigmoid diverticulosis. Inflammatory stranding around
the proximal to mid sigmoid colon compatible with active
diverticulitis. Normal appendix. Stomach and small bowel
decompressed, unremarkable.

Vascular/Lymphatic: No evidence of aneurysm or adenopathy.

Reproductive: No visible focal abnormality.

Other: No free fluid or free air. Partially imaged in the
subcutaneous soft tissues of the left lateral hip region overlying
the greater trochanter is a 2.9 x 1.8 cm soft tissue and possible
fluid collection. This could be related to subcutaneous injection.
Cannot exclude small subcutaneous abscess. Recommend clinical
correlation.

Musculoskeletal: No acute bony abnormality.
IMPRESSION: Sigmoid diverticulosis with changes of active diverticulitis.

In the superficial subcutaneous soft tissues overlying the left
greater trochanter in the left lateral hip region is a 2.8 cm soft
tissue fluid collection of unknown etiology. Cannot exclude small
abscess. Recommend clinical correlation.

## 2022-12-19 MED ORDER — STUDY - OCEAN(A) - OLPASIRAN (AMG 890) 142 MG/ML OR PLACEBO SQ INJECTION (PI-HILTY)
142.0000 mg | PREFILLED_SYRINGE | Freq: Once | SUBCUTANEOUS | Status: AC
  Administered 2022-12-19: 142 mg via SUBCUTANEOUS
  Filled 2022-12-19: qty 1

## 2022-12-19 NOTE — Research (Signed)
cean A  Week 48  Patient doing good, no complaints of chest pain, shortness of breath, or swelling.   Vitals: [x]  Experience any AE/SAE/Hospitalizations []  Yes [x]  No  If yes please explain:  Labs collected:  0844 EKG: 0839 MD to see: TS  Post PK draw (1 hr to 72 hr post IP) : 11:10 Discussed with patient about the importance of not letting any one draw cholesterol or lipids. As to we are blinded to results. Reassured patient if we needed to be contacted the study team would reach out to our unblinded person.   *No labs next visit*  Non-Fatal Potential Endpoint Assessment Yes  No   Has the subject experienced/undergone any of the following since the last visit/contact?   []   [x]    Any Coronary Artery Revascularization/Cerebrovascular Revascularization/ Peripheral Artery Revascularization/Amputation Procedure   []   [x]    Myocardial Infarction []   [x]    Stroke   []   [x]    Provide the date for the non-fatal Potential Endpoints status:   []   [x]     IP admin please see MAR (please add Box # to comment section on MAR) [x]    Current Outpatient Medications:    acetaminophen (TYLENOL) 325 MG tablet, Take 2 tablets (650 mg total) by mouth every 4 (four) hours as needed for headache or mild pain., Disp: , Rfl:    anastrozole (ARIMIDEX) 1 MG tablet, Take 1 mg by mouth once a week., Disp: , Rfl:    aspirin 81 MG chewable tablet, Chew 1 tablet (81 mg total) by mouth daily., Disp: , Rfl:    atorvastatin (LIPITOR) 80 MG tablet, TAKE 1 TABLET BY MOUTH EVERY DAY, Disp: 90 tablet, Rfl: 3   B Complex Vitamins (B-COMPLEX/B-12 PO), Take 1 tablet by mouth daily., Disp: , Rfl:    BRILINTA 90 MG TABS tablet, TAKE 1 TABLET BY MOUTH TWICE A DAY, Disp: 180 tablet, Rfl: 1   Cholecalciferol (D3 5000 PO), Take 1 tablet by mouth daily., Disp: , Rfl:    ketoconazole (NIZORAL) 2 % shampoo, Apply 1 Application topically as needed for irritation., Disp: , Rfl:    levothyroxine (SYNTHROID) 112 MCG tablet, Take  112 mcg by mouth daily., Disp: , Rfl:    MAGNESIUM PO, Take 1 tablet by mouth daily., Disp: , Rfl:    metFORMIN (GLUCOPHAGE) 500 MG tablet, Take 500 mg by mouth daily., Disp: , Rfl:    Multiple Vitamin (MULTIVITAMIN) tablet, Take 1 tablet by mouth daily., Disp: , Rfl:    Niacinamide-Zn-Cu-Methfo-Se-Cr (NICOTINAMIDE PO), Take 1 capsule by mouth daily., Disp: , Rfl:    nitroGLYCERIN (NITROSTAT) 0.4 MG SL tablet, Place 1 tablet (0.4 mg total) under the tongue every 5 (five) minutes as needed for chest pain., Disp: 25 tablet, Rfl: 3   Omega-3 Fatty Acids (OMEGA-3 PLUS PO), Take 1 capsule by mouth daily., Disp: , Rfl:    zinc gluconate 50 MG tablet, Take 50 mg by mouth daily., Disp: , Rfl:

## 2022-12-21 NOTE — Research (Addendum)
Are there any labs that are clinically significant?  Yes [x]  OR No[]   Please FORWARD back to me with any changes or follow up!  SEE RECOMMENDATIONS BELOW   ACCESSION NO. [id number]                                             Page 1 of 1                                                        INVESTIGATOR: ([id number])                          PROTOCOL   [id number]                     Zoila Shutter M.D.                             INVESTIGATOR NO.: [id number]                     c/o [name]                              SUBJECT ID: [id number]                     [facility]                 SUBJECT INITIALS NOT COLLECTED:                     [address] [place] [address]                 VISIT: Michelene Gardener, [place]                   SPONSOR REPORT TO:                 COLLECTION XLKG:40:10 DATE:19-Dec-2022                     Mertha Baars                      DATE RECEIVED IN LABORATORY: 20-Dec-2022                     c/o Sponsor(or Addtl) El[employer].Study DATE REPORTED BY LABORATORY: 20-Dec-2022                     [facility]                          SEX: M  BIRTHDATE:  14-Oct-1975    AGE: 25O                     [address]  RANDOMIZATION NO.: [id number]                   [place], [place] [address]                                                                                        Ref. Ranges               Clinical    Comments                                                                          Significance                                                                            Yes*  No                    SM [id number] ANTIBODY COLL D/T                      CDate PreD     19-Dec-2022                                               CTime PreD     08:44                                                   SM AMG890 LPA COLLECTION D/T                      Coll Date      19-Dec-2022                                                Coll Time      08:44                                                   SM AMG890 PK COLLECTION D/T  Date PostD     19-Dec-2022                                               PostDoseTM     11:10                                                   SM AMG890 BMD SERUM COLL D/T                      Coll Date      19-Dec-2022                                               Coll Time      08:44                                                   SM [id number] BMD PLASMA COLL D/T                      Coll Date      19-Dec-2022                                               Coll Time      08:44                                      FASTING GLUCOSE                      Glucose        93           70-100 mg/dL                               IS SUBJECT FASTING?                      Fasting?       Yes          CHEMISTRY PANEL                      Total Bili     0.5          0.2-1.2 mg/dL                                Dir Bili       <0.1         0.0-0.4 mg/dL  Ind Bili       <0.5         0.0-1.2 mg/dL                                Alk Phos       65           40-129 U/L                                   ALT (SGPT)     25           5-48 U/L                                    AST (SGOT)     32           8-40 U/L                                    Urea Nitr      24           4-24 mg/dL                                   Creatinine     1.05         0.45-1.24 mg/dL                              Calcium        8.5          8.3-10.6 mg/dL                              Total Prot     6.3          6.1-8.4 g/dL                                 Alb BCG        4.3          3.3-4.9 g/dL                            CK        1026    H    39-308 U/L    LAST CHECKED    11/05/22 = 167                        Sodium         139          132-147 mEq/L                                Potassium      4.0          3.5-5.2 mEq/L  Bicarb          22.2         19.3-29.3 mEq/L                              Chloride       106          94-112 mEq/L                               ADJUSTED CALCIUM                      Adj Calc       8.5          8.3-10.6 mg/dL         HEMATOLOGY&DIFFERENTIAL PANEL                      HGB            16.5         12.7-18.1 g/dL                              HCT            47           39-54 %                                      RBC            5.1          4.5-6.4 x106/uL                              MCH            32           26-34 pg                                    MCHC           35           31-38 g/dL                                   RDW            12.7         12.0-15.0 %                                 RBC Morph      No Review Required                                       MCV            93           79-96 fL  WBC            6.26         3.80-10.70 x103/uL                           Neutrophil     3.81         1.96-7.23 x103/uL                           Lymphocyte     1.98         0.91-4.28 x103/uL                           Monocytes      0.28         0.12-0.92 x103/uL                           Eosinophil     0.18         0.00-0.57 x103/uL                           Basophils      0.02         0.00-0.20 x103/uL                           Neutrophil     60.8         40.5-75.0 %                                 Lymphocyte     31.5         15.4-48.5%                                   Monocytes      4.5          2.6-10.1 %                                   Eosinophil     2.9          0.0-6.8 %                                    Basophils      0.3          0.0-2.0 %                                    Platelets      236          140-400 x103/uL                            ANC                      ANC            3.81  1.96-7.23 x103/uL                     ANION GAP                      Anion Gap      15           7-18 mEq/L                   EGFR                       CKDEPI eGF     87           mL/min/1.23m2                  COAGULATION GROUP                      APTT         25.8         21.9-29.4 sec                                PT             11.4         9.7-12.3 sec                                 INR            1.1          Patient not taking                                                       oral anticoagulant:                                                      0.8 - 1.2                                                                Patient taking                                                          oral anticoagulant:                                                      2.0 - 3.0  ALT & INR                      ALT & INR      To follow                                              AST & INR                      AST & INR      To follow                                                    No Ref Rng             HS-CRP                      CRPHS          0.13         0.00-0.50 mg/dL                            HBA1C                      Hgb A1c        5.8          <6.5%                ALT > 3 X ULN                      ALT>3XULN      Criteria not met                                        ALT > 5 X ULN                      ALT>5XULN      Criteria not met                                        ALT > 8 X ULN                      ALT>8XULN      Criteria not met                                        ALT & TBIL                      ALT & TBIL     Criteria not met  AST > 3 X ULN                      AST>3XULN      Criteria not met                                        AST > 5 X ULN                      AST>5XULN      Criteria not met                                        AST > 8 X ULN                      AST>8XULN      Criteria not met                                        AST & TBIL                      AST & TBIL     Criteria not met                   ALT & INR                      ALT & INR      Criteria not met                                        AST & INR                      AST & INR      Criteria not met   CK is very high - need to check with the patient to see if he is having any muscle pain or symptoms. Would likely recommend stopping statin if he is on it.  Chrystie Nose, MD, [facility] FACP  [place]  [facility]  Medical Director of the Advanced Lipid Disorders &  Cardiovascular Risk Reduction Clinic Diplomate of the American Board of Clinical Lipidology Attending Cardiologist  Direct Dial: [phone]  Fax: [phone]  Website:  [email or web address]

## 2022-12-24 NOTE — Research (Signed)
CK elevated  Spoke with patient 12-24-2022 he will come in Wednesday 12-26-2022 for a retest of his CK

## 2022-12-26 ENCOUNTER — Encounter: Payer: BC Managed Care – PPO | Admitting: *Deleted

## 2022-12-26 DIAGNOSIS — R1084 Generalized abdominal pain: Secondary | ICD-10-CM | POA: Diagnosis not present

## 2022-12-26 DIAGNOSIS — Z006 Encounter for examination for normal comparison and control in clinical research program: Secondary | ICD-10-CM

## 2022-12-26 NOTE — Research (Signed)
Redraw chem test @ 1110

## 2022-12-28 NOTE — Research (Addendum)
Are there any labs that are clinically significant?  Yes [x]  OR No[]   Please FORWARD back to me with any changes or follow up! Labs in red and bold are abnormal.    Repeat CK remains high - would advise stopping statin, hydrating and repeat CK in 2-4 weeks.  Chrystie Nose, MD, [facility], FACP  [place]  [facility]  Medical Director of the Advanced Lipid Disorders &  Cardiovascular Risk Reduction Clinic Diplomate of the American Board of Clinical Lipidology Attending Cardiologist  Direct Dial: [phone]  Fax: [phone]  Website:  [email or web address]   ACCESSION NO. [id number]                                             Page 1 of 1                                                        INVESTIGATOR: ([id number])                          PROTOCOL   [id number]                     Todd [name] M.D.                             INVESTIGATOR NO.: [id number]                     c/o [name]                              SUBJECT ID: [id number]                     [facility]                 SUBJECT INITIALS NOT COLLECTED:                     [address] [place] [address]                 VISIT: Todd [name], [place] [id number]                   SPONSOR REPORT TO:                 COLLECTION TIME:11:10 DATE:26-Dec-2022                     Mertha Baars                      DATE RECEIVED IN LABORATORY: 27-Dec-2022                     c/o Sponsor(or Addtl) [employer].Study DATE REPORTED BY LABORATORY: 27-Dec-2022                     [facility]  SEX: M  BIRTHDATE:  14-Oct-1975    AGE: 47y                     [address]                  [place] NO.: [phone], [place] [address]                                                                                        Ref. Ranges               Clinical    Comments                                                                           Significance                                                                            Yes*  No                    IS SUBJECT FASTING?                      Fasting?       Yes                                         ANION GAP                      Anion Gap      17           7-18 mEq/L                          EGFR                      CKDEPI eGF     85           mL/min/1.22m2                                                            No Ref Rng  CHEMISTRY PANEL                      Total Bili     0.4          0.2-1.2 mg/dL                                Dir Bili       0.1          0.0-0.4 mg/dL                                Ind Bili       0.3          0.0-1.2 mg/dL                                Alk Phos       67           40-129 U/L                                   ALT (SGPT)     31           5-48 U/L                                    AST (SGOT)     39           8-40 U/L                                    Urea Nitr      27      H    4-24 mg/dL             urea 84/69/62 = 24                      Creatinine     1.07         0.45-1.24 mg/dL                              Calcium        8.7          8.3-10.6 mg/dL                              Total Prot     6.5          6.1-8.4 g/dL                                 Alb BCG        4.3          3.3-4.9 g/dL                                 CK  1240    H    39-308 U/L             CK 12/19/22 = 1026                      Sodium         138          132-147 mEq/L                                Potassium      3.8          3.5-5.2 mEq/L                                Bicarb         20.0         19.3-29.3 mEq/L                              Chloride       105          94-112 mEq/L                               ADJUSTED CALCIUM                      Adj Calc       8.7          8.3-10.6 mg/dL             ALT > 3 X ULN                      ALT>3XULN      Criteria not met                                         ALT > 5 X ULN                      ALT>5XULN      Criteria not met                                        ALT > 8 X ULN                      ALT>8XULN      Criteria not met                                        ALT & TBIL                      ALT & TBIL     Criteria not met                                        AST > 3  X ULN                      AST>3XULN      Criteria not met                                        AST > 5 X ULN                      AST>5XULN      Criteria not met                                        AST > 8 X ULN                      AST>8XULN      Criteria not met                                        AST & TBIL                      AST & TBIL     Criteria not met

## 2022-12-31 NOTE — Research (Signed)
Called patient to let him know his CK is still elevated.  He is going to stop his atorvastatin as of today 12/31/22 Told him to hydrate really well  He will come back in Oct 11th at 0830 to have his CK rechecked   Mercer Pod :) RN BSN  Clinical Research Nurse  Be strong and take heart, all you who hope in the Lord. ~ Psalm 31:24

## 2023-01-10 DIAGNOSIS — D751 Secondary polycythemia: Secondary | ICD-10-CM | POA: Diagnosis not present

## 2023-01-10 DIAGNOSIS — Z79890 Hormone replacement therapy (postmenopausal): Secondary | ICD-10-CM | POA: Diagnosis not present

## 2023-01-25 ENCOUNTER — Encounter: Payer: BC Managed Care – PPO | Admitting: *Deleted

## 2023-01-25 DIAGNOSIS — Z006 Encounter for examination for normal comparison and control in clinical research program: Secondary | ICD-10-CM

## 2023-01-25 NOTE — Research (Signed)
Patient came in for his repeat CK level after he has been off his atrovastatin since   Asked patient about anastrozole and testosterone. Saw cards on May 6th 2024 and card was ok with him starting his anazole again. Start date for anasozole and testosteron is Aug 27, 2022 Anastrazole is for decreasing estrogen  Todd [name] is for helping with testorone levels His PCP keeps up with his levels   He had stopped both drugs after his event. He started back after he discussed it with his cardiologist.   All other meds are correct.   Explained the importance of blinding to his cholesterol and explained that we will keep a close check on it.   Patient also updated his ICF with his printed name and initial and dated the correction.     Current Outpatient Medications:    acetaminophen (TYLENOL) 325 MG tablet, Take 2 tablets (650 mg total) by mouth every 4 (four) hours as needed for headache or mild pain., Disp: , Rfl:    anastrozole (ARIMIDEX) 1 MG tablet, Take 0.5 mg by mouth once a week., Disp: , Rfl:    aspirin 81 MG chewable tablet, Chew 1 tablet (81 mg total) by mouth daily., Disp: , Rfl:    B Complex Vitamins (B-COMPLEX/B-12 PO), Take 1 tablet by mouth daily., Disp: , Rfl:    BRILINTA 90 MG TABS tablet, TAKE 1 TABLET BY MOUTH TWICE A DAY, Disp: 180 tablet, Rfl: 1   Cholecalciferol (D3 5000 PO), Take 1 tablet by mouth daily., Disp: , Rfl:    ketoconazole (NIZORAL) 2 % shampoo, Apply 1 Application topically as needed for irritation., Disp: , Rfl:    levothyroxine (SYNTHROID) 112 MCG tablet, Take 112 mcg by mouth daily., Disp: , Rfl:    MAGNESIUM PO, Take 1 tablet by mouth daily., Disp: , Rfl:    metFORMIN (GLUCOPHAGE) 500 MG tablet, Take 500 mg by mouth daily., Disp: , Rfl:    Multiple Vitamin (MULTIVITAMIN) tablet, Take 1 tablet by mouth daily., Disp: , Rfl:    Niacinamide-Zn-Cu-Methfo-Se-Cr (NICOTINAMIDE PO), Take 1 capsule by mouth daily., Disp: , Rfl:    nitroGLYCERIN (NITROSTAT) 0.4 MG SL  tablet, Place 1 tablet (0.4 mg total) under the tongue every 5 (five) minutes as needed for chest pain., Disp: 25 tablet, Rfl: 3   Omega-3 Fatty Acids (OMEGA-3 PLUS PO), Take 1 capsule by mouth daily., Disp: , Rfl:    testosterone cypionate (DEPOTESTOSTERONE CYPIONATE) 200 MG/ML injection, Inject 0.3 mLs into the muscle once a week., Disp: , Rfl:    zinc gluconate 50 MG tablet, Take 50 mg by mouth daily., Disp: , Rfl:    atorvastatin (LIPITOR) 80 MG tablet, TAKE 1 TABLET BY MOUTH EVERY DAY (Patient not taking: Reported on 01/25/2023), Disp: 90 tablet, Rfl: 3

## 2023-01-28 NOTE — Research (Addendum)
Are there any labs that are clinically significant?  Yes [x]  OR No[]   Please FORWARD back to me with any changes or follow up!    CK elevated, but improved from prior level - off statin therapy.  Chrystie Nose, MD, [facility], FACP  [place]  [facility]  Medical Director of the Advanced Lipid Disorders &  Cardiovascular Risk Reduction Clinic Diplomate of the American Board of Clinical Lipidology Attending Cardiologist  Direct Dial: [phone]  Fax: [phone]  Website:  [email or web address]    ACCESSION NO. [id number]                                             Page 1 of 1                                                        INVESTIGATOR: ([id number])                          PROTOCOL   [id number]                     Todd [name] M.D.                             INVESTIGATOR NO.: [id number]                     c/o [name]                              SUBJECT ID: [id number]                     [facility]                 SUBJECT INITIALS NOT COLLECTED:                     [address]                 VISIT: [place], [place] [id number]                   SPONSOR REPORT TO:                 COLLECTION TIME:08:33 DATE:25-Jan-2023                     Mertha Baars                      DATE RECEIVED IN LABORATORY: 26-Jan-2023                     c/o Sponsor(or Addtl) El[employer].Study DATE REPORTED BY LABORATORY: 28-Jan-2023                     [facility]  SEX: M  BIRTHDATE:  14-Oct-1975    AGE: 14X                     [address]                  [place]am[place] NO.: [phone], [place] [address]                                                                                        Ref. Ranges               Clinical    Comments                                                                          Significance                                                                             Yes*  No                    ALT > 3 X ULN                      ALT>3XULN      Criteria not met                                        ALT > 5 X ULN                      ALT>5XULN      Criteria not met                                        ALT > 8 X ULN                      ALT>8XULN      Criteria not met                                        ALT & TBIL  ALT & TBIL     Criteria not met                                        AST > 3 X ULN                      AST>3XULN      Criteria not met                                        AST > 5 X ULN                      AST>5XULN      Criteria not met                                        AST > 8 X ULN                      AST>8XULN      Criteria not met                                        AST & TBIL                      AST & TBIL     Criteria not met                        ANION GAP                      Anion Gap      16           7-18 mEq/L                EGFR                      CKDEPI eGF     77           mL/min/1.42m2                                                            No Ref Rng      IS SUBJECT FASTING?                      Fasting?       No          CHEMISTRY PANEL                      Total Bili     0.5          0.2-1.2 mg/dL  Dir Bili       0.1          0.0-0.4 mg/dL                                Ind Bili       0.4          0.0-1.2 mg/dL                                Alk Phos       76           40-129 U/L                                   ALT (SGPT)     26           5-48 U/L                                    AST (SGOT)     32           8-40 U/L                                    Urea Nitr      24           4-24 mg/dL                                   Creatinine     1.16         0.45-1.24 mg/dL                              Calcium        8.7          8.3-10.6 mg/dL                              Total Prot     6.3           6.1-8.4 g/dL                                 Alb BCG        4.2          3.3-4.9 g/dL                                 CK             935     H    39-308 U/L              (12/26/2022  1240)                      Sodium         138          132-147 mEq/L  Potassium      4.4          3.5-5.2 mEq/L                                Bicarb         23.5         19.3-29.3 mEq/L                              Chloride       103          94-112 mEq/L                               ADJUSTED CALCIUM                      Adj Calc       8.7          8.3-10.6 mg/dL

## 2023-01-29 NOTE — Research (Signed)
Called patient to let him know his level decreased but still elevated.  He will stay off statin for now per Dr Rennis Golden recommendation.  Will forward to note to Dr Clifton James  Patient noted that he was on lower dose prior to MI and didn't have any issues with his levels then.   Mercer Pod :) RN BSN  Clinical Research Nurse  Be strong and take heart, all you who hope in the [place]. ~ Psalm 31:24

## 2023-02-24 NOTE — Progress Notes (Unsigned)
No chief complaint on file.  History of Present Illness: 47 yo male with history of CAD, hyperlipidemia. HTN, hypothyoridism and VT who is here today for cardiac follow up. He was admitted to [facility] with a lateral STEMI in September 2023. He presented to [facility] ED with chest pain and had a cardiac arrest. He was transferred to [facility] for emergent cardiac cath which showed an occluded proximal Circumflex. Two drug eluting stents were placed. OM2 was treated with balloon angioplasty. Mild disease in the LAD and RCA. Echo 12/22/21 with LVEF=50-55%. No valve disease.   He is here today for follow up. The patient denies any chest pain, dyspnea, palpitations, lower extremity edema, orthopnea, PND, dizziness, near syncope or syncope.   Primary Care Physician: Vivien Presto, MD   Past Medical History:  Diagnosis Date   CAD (coronary artery disease)    Hyperlipidemia    NSVT (nonsustained ventricular tachycardia) (HCC) 12/23/2021   Thyroid disease     Past Surgical History:  Procedure Laterality Date   CORONARY/GRAFT ACUTE MI REVASCULARIZATION N/A 12/21/2021   Procedure: Coronary/Graft Acute MI Revascularization;  Surgeon: Kathleene Hazel, MD;  Location: [facility] INVASIVE CV LAB;  Service: Cardiovascular;  Laterality: N/A;   EYE SURGERY     LEFT HEART CATH AND CORONARY ANGIOGRAPHY N/A 12/21/2021   Procedure: LEFT HEART CATH AND CORONARY ANGIOGRAPHY;  Surgeon: Kathleene Hazel, MD;  Location: [facility] INVASIVE CV LAB;  Service: Cardiovascular;  Laterality: N/A;    Current Outpatient Medications  Medication Sig Dispense Refill   acetaminophen (TYLENOL) 325 MG tablet Take 2 tablets (650 mg total) by mouth every 4 (four) hours as needed for headache or mild pain.     anastrozole (ARIMIDEX) 1 MG tablet Take 0.5 mg by mouth once a week.     aspirin 81 MG chewable tablet Chew 1 tablet (81 mg total) by mouth daily.     atorvastatin (LIPITOR) 80 MG tablet TAKE 1 TABLET BY MOUTH EVERY DAY (Patient  not taking: Reported on 01/25/2023) 90 tablet 3   B Complex Vitamins (B-COMPLEX/B-12 PO) Take 1 tablet by mouth daily.     BRILINTA 90 MG TABS tablet TAKE 1 TABLET BY MOUTH TWICE A DAY 180 tablet 1   Cholecalciferol (D3 5000 PO) Take 1 tablet by mouth daily.     ketoconazole (NIZORAL) 2 % shampoo Apply 1 Application topically as needed for irritation.     levothyroxine (SYNTHROID) 112 MCG tablet Take 112 mcg by mouth daily.     MAGNESIUM PO Take 1 tablet by mouth daily.     metFORMIN (GLUCOPHAGE) 500 MG tablet Take 500 mg by mouth daily.     Multiple Vitamin (MULTIVITAMIN) tablet Take 1 tablet by mouth daily.     Niacinamide-Zn-Cu-Methfo-Se-Cr (NICOTINAMIDE PO) Take 1 capsule by mouth daily.     nitroGLYCERIN (NITROSTAT) 0.4 MG SL tablet Place 1 tablet (0.4 mg total) under the tongue every 5 (five) minutes as needed for chest pain. 25 tablet 3   Omega-3 Fatty Acids (OMEGA-3 PLUS PO) Take 1 capsule by mouth daily.     testosterone cypionate (DEPOTESTOSTERONE CYPIONATE) 200 MG/ML injection Inject 0.3 mLs into the muscle once a week.     zinc gluconate 50 MG tablet Take 50 mg by mouth daily.     No current facility-administered medications for this visit.    No Known Allergies  Social History   Socioeconomic History   Marital status: Married    Spouse name: Not on file  Number of children: Not on file   Years of education: Not on file   Highest education level: Not on file  Occupational History   Not on file  Tobacco Use   Smoking status: Never   Smokeless tobacco: Not on file  Substance and Sexual Activity   Alcohol use: No   Drug use: No   Sexual activity: Not on file  Other Topics Concern   Not on file  Social History Narrative   Not on file   Social Determinants of Health   Financial Resource Strain: Low Risk  (12/03/2022)   Received from [facility]   Overall Financial Resource Strain (CARDIA)    Difficulty of Paying Living Expenses: Not very hard  Food Insecurity:  No Food Insecurity (12/03/2022)   Received from [facility]   Hunger Vital Sign    Worried About Running Out of Food in the Last Year: Never true    Ran Out of Food in the Last Year: Never true  Transportation Needs: No Transportation Needs (12/03/2022)   Received from [facility] - Transportation    Lack of Transportation (Medical): No    Lack of Transportation (Non-Medical): No  Physical Activity: Sufficiently Active (12/03/2022)   Received from [facility]   Exercise Vital Sign    Days of Exercise per Week: 5 days    Minutes of Exercise per Session: 120 min  Stress: No Stress Concern Present (12/03/2022)   Received from [facility] - Occupational Stress Questionnaire    Feeling of Stress : Only a little  Social Connections: Socially Integrated (12/03/2022)   Received from [facility]   Social Network    How would you rate your social network (family, work, friends)?: Good participation with social networks  Intimate Partner Violence: Not At Risk (12/03/2022)   Received from [facility]   HITS    Over the last 12 months how often did your partner physically hurt you?: Never    Over the last 12 months how often did your partner insult you or talk down to you?: Never    Over the last 12 months how often did your partner threaten you with physical harm?: Never    Over the last 12 months how often did your partner scream or curse at you?: Never    Family History  Problem Relation Age of Onset   Heart attack Mother 4    Review of Systems:  As stated in the HPI and otherwise negative.   There were no vitals taken for this visit.  Physical Examination: General: Well developed, well nourished, NAD  HEENT: OP clear, mucus membranes moist  SKIN: warm, dry. No rashes. Neuro: No focal deficits  Musculoskeletal: Muscle strength 5/5 all ext  Psychiatric: Mood and affect normal  Neck: No JVD, no carotid bruits, no  thyromegaly, no lymphadenopathy.  Lungs:Clear bilaterally, no wheezes, rhonci, crackles Cardiovascular: Regular rate and rhythm. No murmurs, gallops or rubs. Abdomen:Soft. Bowel sounds present. Non-tender.  Extremities: No lower extremity edema. Pulses are 2 + in the bilateral DP/PT.  EKG:  EKG is not *** ordered today. The ekg ordered today demonstrates   Echo 12/22/21:  1. Hypokinesis of the inferolateral wall with overall low normal LV  function.   2. Left ventricular ejection fraction, by estimation, is 50 to 55%. The  left ventricle has low normal function. The left ventricle demonstrates  regional wall motion abnormalities (see scoring diagram/findings for  description).  Left ventricular diastolic   parameters are consistent with Grade I diastolic dysfunction (impaired  relaxation).   3. Right ventricular systolic function is normal. The right ventricular  size is normal.   4. The mitral valve is normal in structure. Trivial mitral valve  regurgitation. No evidence of mitral stenosis.   5. The aortic valve is tricuspid. Aortic valve regurgitation is not  visualized. Aortic valve sclerosis is present, with no evidence of aortic  valve stenosis.   6. The inferior vena cava is normal in size with greater than 50%  respiratory variability, suggesting right atrial pressure of 3 mmHg.    Recent Labs: 05/18/2022: ALT 19   Lipid Panel    Component Value Date/Time   CHOL 146 05/18/2022 0810   TRIG 146 05/18/2022 0810   HDL 39 (L) 05/18/2022 0810   CHOLHDL 3.7 05/18/2022 0810   CHOLHDL 5.3 12/21/2021 1159   VLDL 25 12/21/2021 1159   LDLCALC 81 05/18/2022 0810     Wt Readings from Last 3 Encounters:  12/19/22 115 kg  08/20/22 125.2 kg  07/30/22 124.9 kg    Assessment and Plan:   1. CAD without angina: He has no chest pain suggestive of angina. Continue ASA, statin. No beta blocker given his issues with libido and exercise intolerance.   2. HTN: BP is controlled. No  changes  3. Hyperlipidemia: LDL near goal in February 9147. ? Statin stopped due to elevated CK level. *** Repeat lipids and LFTs now.   Labs/ tests ordered today include:   No orders of the defined types were placed in this encounter.  Disposition:   F/U with me in 12 months  Signed, Verne Carrow, MD, [facility] 02/24/2023 11:26 AM    [facility] [address] [place], [place], [place]  [address] Phone: [phone]; Fax: [phone]

## 2023-02-25 ENCOUNTER — Encounter: Payer: Self-pay | Admitting: Cardiovascular Disease

## 2023-02-25 ENCOUNTER — Ambulatory Visit: Payer: BC Managed Care – PPO | Attending: Cardiovascular Disease | Admitting: Cardiovascular Disease

## 2023-02-25 VITALS — BP 126/82 | HR 81 | Ht 71.0 in | Wt 262.0 lb

## 2023-02-25 DIAGNOSIS — I10 Essential (primary) hypertension: Secondary | ICD-10-CM | POA: Diagnosis not present

## 2023-02-25 DIAGNOSIS — I2510 Atherosclerotic heart disease of native coronary artery without angina pectoris: Secondary | ICD-10-CM | POA: Diagnosis not present

## 2023-02-25 DIAGNOSIS — E782 Mixed hyperlipidemia: Secondary | ICD-10-CM

## 2023-02-25 NOTE — Patient Instructions (Signed)
Medication Instructions:  No changes *If you need a refill on your cardiac medications before your next appointment, please call your pharmacy*   Lab Work: none   Testing/Procedures: none   Follow-Up: At [facility], you and your health needs are our priority.  As part of our continuing mission to provide you with exceptional heart care, we have created designated Provider Care Teams.  These Care Teams include your primary Cardiologist (physician) and Advanced Practice Providers (APPs -  Physician Assistants and Nurse Practitioners) who all work together to provide you with the care you need, when you need it.   Your next appointment:   12 month(s)  Provider:   Christopher McAlhany, MD   

## 2023-04-11 DIAGNOSIS — H40033 Anatomical narrow angle, bilateral: Secondary | ICD-10-CM | POA: Diagnosis not present

## 2023-04-11 DIAGNOSIS — H04123 Dry eye syndrome of bilateral lacrimal glands: Secondary | ICD-10-CM | POA: Diagnosis not present

## 2023-04-22 ENCOUNTER — Encounter: Payer: BC Managed Care – PPO | Admitting: *Deleted

## 2023-04-22 VITALS — BP 131/76 | HR 63 | Temp 98.3°F | Resp 16 | Wt 265.0 lb

## 2023-04-22 DIAGNOSIS — Z006 Encounter for examination for normal comparison and control in clinical research program: Secondary | ICD-10-CM

## 2023-04-22 MED ORDER — STUDY - OCEAN(A) - OLPASIRAN (AMG 890) 142 MG/ML OR PLACEBO SQ INJECTION (PI-HILTY)
142.0000 mg | PREFILLED_SYRINGE | Freq: Once | SUBCUTANEOUS | Status: AC
  Administered 2023-04-22: 142 mg via SUBCUTANEOUS
  Filled 2023-04-22: qty 1

## 2023-04-22 NOTE — Research (Signed)
 cean A  Week 60 Vitals: [x]  Experience any AE/SAE/Hospitalizations []  Yes [x]  No  If yes please explain:   Only urine lab needed is urine preg if applicable: []  Yes [x]  No if yes time:   Discussed with patient about the importance of not letting any one draw cholesterol or lipids. As to we are blinded to results. Reassured patient if we needed to be contacted the study team would reach out to our unblinded person.    Non-Fatal Potential Endpoint Assessment Yes  No   Has the subject experienced/undergone any of the following since the last visit/contact?   []   [x]    Any Coronary Artery Revascularization/Cerebrovascular Revascularization/ Peripheral Artery Revascularization/Amputation Procedure   []   [x]    Myocardial Infarction []   [x]    Stroke   []   [x]    Provide the date for the non-fatal Potential Endpoints status:   []   [x]     IP admin please see MAR (please add Box # to comment section on MAR) [x]   Finished Brilinta  04/20/2023   Current Outpatient Medications:    anastrozole (ARIMIDEX) 1 MG tablet, Take 0.5 mg by mouth once a week., Disp: , Rfl:    aspirin  81 MG chewable tablet, Chew 1 tablet (81 mg total) by mouth daily., Disp: , Rfl:    B Complex Vitamins (B-COMPLEX/B-12 PO), Take 1 tablet by mouth daily., Disp: , Rfl:    Cholecalciferol (D3 5000 PO), Take 1 tablet by mouth daily., Disp: , Rfl:    ketoconazole (NIZORAL) 2 % shampoo, Apply 1 Application topically as needed for irritation., Disp: , Rfl:    levothyroxine  (SYNTHROID ) 112 MCG tablet, Take 112 mcg by mouth daily., Disp: , Rfl:    MAGNESIUM  PO, Take 1 tablet by mouth daily., Disp: , Rfl:    metFORMIN  (GLUCOPHAGE ) 500 MG tablet, Take 500 mg by mouth daily., Disp: , Rfl:    Multiple Vitamin (MULTIVITAMIN) tablet, Take 1 tablet by mouth daily., Disp: , Rfl:    Niacinamide-Zn-Cu-Methfo-Se-Cr (NICOTINAMIDE PO), Take 1 capsule by mouth daily., Disp: , Rfl:    nitroGLYCERIN  (NITROSTAT ) 0.4 MG SL tablet, Place 1 tablet  (0.4 mg total) under the tongue every 5 (five) minutes as needed for chest pain., Disp: 25 tablet, Rfl: 3   Omega-3 Fatty Acids (OMEGA-3 PLUS PO), Take 1 capsule by mouth daily., Disp: , Rfl:    testosterone  cypionate (DEPOTESTOSTERONE CYPIONATE) 200 MG/ML injection, Inject 0.3 mLs into the muscle once a week., Disp: , Rfl:    zinc gluconate 50 MG tablet, Take 50 mg by mouth daily., Disp: , Rfl:    atorvastatin  (LIPITOR ) 80 MG tablet, TAKE 1 TABLET BY MOUTH EVERY DAY (Patient not taking: Reported on 04/22/2023), Disp: 90 tablet, Rfl: 3  Current Facility-Administered Medications:    Study - [name] - olpasiran (AMG 890) 142 mg/mL or placebo SQ injection (PI-Hilty), 142 mg, Subcutaneous, Once,

## 2023-06-14 DIAGNOSIS — M792 Neuralgia and neuritis, unspecified: Secondary | ICD-10-CM | POA: Diagnosis not present

## 2023-06-14 DIAGNOSIS — M542 Cervicalgia: Secondary | ICD-10-CM | POA: Diagnosis not present

## 2023-06-17 ENCOUNTER — Observation Stay (HOSPITAL_COMMUNITY)
Admission: EM | Admit: 2023-06-17 | Discharge: 2023-06-18 | Disposition: A | Discharge status: Home / Self Care | Attending: Cardiology | Admitting: Cardiology

## 2023-06-17 ENCOUNTER — Other Ambulatory Visit: Payer: Self-pay

## 2023-06-17 DIAGNOSIS — E039 Hypothyroidism, unspecified: Secondary | ICD-10-CM | POA: Diagnosis not present

## 2023-06-17 DIAGNOSIS — I219 Acute myocardial infarction, unspecified: Secondary | ICD-10-CM | POA: Diagnosis not present

## 2023-06-17 DIAGNOSIS — Z79899 Other long term (current) drug therapy: Secondary | ICD-10-CM | POA: Insufficient documentation

## 2023-06-17 DIAGNOSIS — R000 Tachycardia, unspecified: Secondary | ICD-10-CM | POA: Diagnosis not present

## 2023-06-17 DIAGNOSIS — R079 Chest pain, unspecified: Secondary | ICD-10-CM | POA: Diagnosis not present

## 2023-06-17 DIAGNOSIS — I10 Essential (primary) hypertension: Secondary | ICD-10-CM | POA: Diagnosis not present

## 2023-06-17 DIAGNOSIS — Z7982 Long term (current) use of aspirin: Secondary | ICD-10-CM | POA: Diagnosis not present

## 2023-06-17 DIAGNOSIS — E785 Hyperlipidemia, unspecified: Secondary | ICD-10-CM | POA: Diagnosis present

## 2023-06-17 DIAGNOSIS — I2510 Atherosclerotic heart disease of native coronary artery without angina pectoris: Secondary | ICD-10-CM | POA: Diagnosis not present

## 2023-06-17 DIAGNOSIS — I252 Old myocardial infarction: Secondary | ICD-10-CM | POA: Diagnosis not present

## 2023-06-17 DIAGNOSIS — D71 Functional disorders of polymorphonuclear neutrophils: Secondary | ICD-10-CM | POA: Diagnosis not present

## 2023-06-17 DIAGNOSIS — Z7984 Long term (current) use of oral hypoglycemic drugs: Secondary | ICD-10-CM | POA: Insufficient documentation

## 2023-06-17 DIAGNOSIS — R0602 Shortness of breath: Principal | ICD-10-CM | POA: Insufficient documentation

## 2023-06-17 NOTE — ED Triage Notes (Addendum)
 Pt BIB EMS from home with c/o SHOB, palpitations, and HTN since 1900 tonight while working out. Pt also c/o  back pain between his shoulder blades.Cardiac hx including cardiac arrest in 12/2021  VS with EMS 138/84 92HR 94RA

## 2023-06-18 ENCOUNTER — Encounter (HOSPITAL_COMMUNITY): Payer: Self-pay | Admitting: Emergency Medicine

## 2023-06-18 ENCOUNTER — Emergency Department (HOSPITAL_COMMUNITY)

## 2023-06-18 ENCOUNTER — Encounter (HOSPITAL_COMMUNITY)
Admission: EM | Disposition: A | Discharge status: Home / Self Care | Payer: Self-pay | Source: Home / Self Care | Attending: Emergency Medicine

## 2023-06-18 ENCOUNTER — Observation Stay (HOSPITAL_BASED_OUTPATIENT_CLINIC_OR_DEPARTMENT_OTHER)

## 2023-06-18 DIAGNOSIS — E785 Hyperlipidemia, unspecified: Secondary | ICD-10-CM

## 2023-06-18 DIAGNOSIS — E7841 Elevated Lipoprotein(a): Secondary | ICD-10-CM

## 2023-06-18 DIAGNOSIS — I25119 Atherosclerotic heart disease of native coronary artery with unspecified angina pectoris: Secondary | ICD-10-CM

## 2023-06-18 DIAGNOSIS — I259 Chronic ischemic heart disease, unspecified: Secondary | ICD-10-CM

## 2023-06-18 DIAGNOSIS — R9431 Abnormal electrocardiogram [ECG] [EKG]: Secondary | ICD-10-CM

## 2023-06-18 DIAGNOSIS — D71 Functional disorders of polymorphonuclear neutrophils: Secondary | ICD-10-CM | POA: Diagnosis not present

## 2023-06-18 DIAGNOSIS — R079 Chest pain, unspecified: Secondary | ICD-10-CM | POA: Diagnosis present

## 2023-06-18 DIAGNOSIS — I2510 Atherosclerotic heart disease of native coronary artery without angina pectoris: Secondary | ICD-10-CM | POA: Diagnosis not present

## 2023-06-18 DIAGNOSIS — I252 Old myocardial infarction: Secondary | ICD-10-CM | POA: Diagnosis not present

## 2023-06-18 DIAGNOSIS — I25118 Atherosclerotic heart disease of native coronary artery with other forms of angina pectoris: Secondary | ICD-10-CM

## 2023-06-18 HISTORY — PX: LEFT HEART CATH AND CORONARY ANGIOGRAPHY: CATH118249

## 2023-06-18 LAB — CBC WITH DIFFERENTIAL/PLATELET
Abs Immature Granulocytes: 0.06 10*3/uL (ref 0.00–0.07)
Basophils Absolute: 0 10*3/uL (ref 0.0–0.1)
Basophils Relative: 0 %
Eosinophils Absolute: 0 10*3/uL (ref 0.0–0.5)
Eosinophils Relative: 0 %
HCT: 41.2 % (ref 39.0–52.0)
Hemoglobin: 13.7 g/dL (ref 13.0–17.0)
Immature Granulocytes: 1 %
Lymphocytes Relative: 23 %
Lymphs Abs: 3 10*3/uL (ref 0.7–4.0)
MCH: 28.4 pg (ref 26.0–34.0)
MCHC: 33.3 g/dL (ref 30.0–36.0)
MCV: 85.5 fL (ref 80.0–100.0)
Monocytes Absolute: 1.2 10*3/uL — ABNORMAL HIGH (ref 0.1–1.0)
Monocytes Relative: 9 %
Neutro Abs: 8.7 10*3/uL — ABNORMAL HIGH (ref 1.7–7.7)
Neutrophils Relative %: 67 %
Platelets: 263 10*3/uL (ref 150–400)
RBC: 4.82 MIL/uL (ref 4.22–5.81)
RDW: 13.4 % (ref 11.5–15.5)
WBC: 13 10*3/uL — ABNORMAL HIGH (ref 4.0–10.5)
nRBC: 0 % (ref 0.0–0.2)

## 2023-06-18 LAB — CBC
HCT: 42.1 % (ref 39.0–52.0)
Hemoglobin: 13.9 g/dL (ref 13.0–17.0)
MCH: 28.2 pg (ref 26.0–34.0)
MCHC: 33 g/dL (ref 30.0–36.0)
MCV: 85.4 fL (ref 80.0–100.0)
Platelets: 251 10*3/uL (ref 150–400)
RBC: 4.93 MIL/uL (ref 4.22–5.81)
RDW: 13.4 % (ref 11.5–15.5)
WBC: 9.9 10*3/uL (ref 4.0–10.5)
nRBC: 0 % (ref 0.0–0.2)

## 2023-06-18 LAB — ECHOCARDIOGRAM COMPLETE
AR max vel: 2.01 cm2
AV Area VTI: 2.13 cm2
AV Area mean vel: 2.01 cm2
AV Mean grad: 6 mmHg
AV Peak grad: 11.6 mmHg
Ao pk vel: 1.7 m/s
Area-P 1/2: 3.72 cm2
Height: 71 in
S' Lateral: 3.3 cm
Weight: 4232.83 [oz_av]

## 2023-06-18 LAB — BASIC METABOLIC PANEL
Anion gap: 12 (ref 5–15)
Anion gap: 9 (ref 5–15)
BUN: 34 mg/dL — ABNORMAL HIGH (ref 6–20)
BUN: 26 mg/dL — ABNORMAL HIGH (ref 6–20)
CO2: 22 mmol/L (ref 22–32)
CO2: 22 mmol/L (ref 22–32)
Calcium: 8.6 mg/dL — ABNORMAL LOW (ref 8.9–10.3)
Calcium: 8.2 mg/dL — ABNORMAL LOW (ref 8.9–10.3)
Chloride: 103 mmol/L (ref 98–111)
Chloride: 106 mmol/L (ref 98–111)
Creatinine, Ser: 1.38 mg/dL — ABNORMAL HIGH (ref 0.61–1.24)
Creatinine, Ser: 1.27 mg/dL — ABNORMAL HIGH (ref 0.61–1.24)
GFR, Estimated: >60 mL/min (ref >60)
GFR, Estimated: >60 mL/min (ref >60)
Glucose, Bld: 97 mg/dL (ref 70–99)
Glucose, Bld: 96 mg/dL (ref 70–99)
Potassium: 3.3 mmol/L — ABNORMAL LOW (ref 3.5–5.1)
Potassium: 3 mmol/L — ABNORMAL LOW (ref 3.5–5.1)
Sodium: 137 mmol/L (ref 135–145)
Sodium: 137 mmol/L (ref 135–145)

## 2023-06-18 LAB — PROTIME-INR
INR: 1 (ref 0.8–1.2)
Prothrombin Time: 13.6 s (ref 11.4–15.2)

## 2023-06-18 LAB — LIPID PANEL
Cholesterol: 207 mg/dL — ABNORMAL HIGH (ref 0–200)
HDL: 46 mg/dL (ref >40)
LDL Cholesterol: 139 mg/dL — ABNORMAL HIGH (ref 0–99)
Total CHOL/HDL Ratio: 4.5 ratio
Triglycerides: 112 mg/dL (ref <150)
VLDL: 22 mg/dL (ref 0–40)

## 2023-06-18 LAB — D-DIMER, QUANTITATIVE: D-Dimer, Quant: 0.28 ug{FEU}/mL (ref 0.00–0.50)

## 2023-06-18 LAB — TROPONIN I (HIGH SENSITIVITY)
Troponin I (High Sensitivity): 33 ng/L — ABNORMAL HIGH (ref <18)
Troponin I (High Sensitivity): 35 ng/L — ABNORMAL HIGH (ref <18)

## 2023-06-18 SURGERY — PERIPHERAL VASCULAR THROMBECTOMY
Anesthesia: LOCAL

## 2023-06-18 SURGERY — LEFT HEART CATH AND CORONARY ANGIOGRAPHY
Anesthesia: LOCAL

## 2023-06-18 MED ORDER — VERAPAMIL HCL 2.5 MG/ML IV SOLN
INTRAVENOUS | Status: AC
Start: 2023-06-18 — End: ?
  Filled 2023-06-18: qty 2

## 2023-06-18 MED ORDER — HEPARIN (PORCINE) IN NACL 1000-0.9 UT/500ML-% IV SOLN
INTRAVENOUS | Status: DC | PRN
  Administered 2023-06-18 (×2): 500 mL

## 2023-06-18 MED ORDER — IOHEXOL 350 MG/ML SOLN
75.0000 mL | Freq: Once | INTRAVENOUS | Status: AC | PRN
  Administered 2023-06-18: 75 mL via INTRAVENOUS

## 2023-06-18 MED ORDER — LIDOCAINE HCL (PF) 1 % IJ SOLN
INTRAMUSCULAR | Status: DC | PRN
  Administered 2023-06-18: 2 mL via INTRADERMAL

## 2023-06-18 MED ORDER — LEVOTHYROXINE SODIUM 112 MCG PO TABS
112.0000 ug | ORAL_TABLET | Freq: Every day | ORAL | Status: DC
  Administered 2023-06-18: 112 ug via ORAL
  Filled 2023-06-18 (×2): qty 1

## 2023-06-18 MED ORDER — FENTANYL CITRATE (PF) 100 MCG/2ML IJ SOLN
INTRAMUSCULAR | Status: DC | PRN
  Administered 2023-06-18: 50 ug via INTRAVENOUS

## 2023-06-18 MED ORDER — VERAPAMIL HCL 2.5 MG/ML IV SOLN
INTRAVENOUS | Status: DC | PRN
  Administered 2023-06-18: 10 mL via INTRA_ARTERIAL

## 2023-06-18 MED ORDER — IOHEXOL 350 MG/ML SOLN
INTRAVENOUS | Status: DC | PRN
  Administered 2023-06-18: 50 mL

## 2023-06-18 MED ORDER — HEPARIN SODIUM (PORCINE) 1000 UNIT/ML IJ SOLN
INTRAMUSCULAR | Status: DC | PRN
  Administered 2023-06-18: 6000 [IU] via INTRAVENOUS

## 2023-06-18 MED ORDER — SODIUM CHLORIDE 0.9% FLUSH: 3.0000 mL | Freq: Two times a day (BID) | INTRAVENOUS | Status: DC

## 2023-06-18 MED ORDER — HEPARIN SODIUM (PORCINE) 1000 UNIT/ML IJ SOLN
INTRAMUSCULAR | Status: AC
  Filled 2023-06-18: qty 10

## 2023-06-18 MED ORDER — ASPIRIN 81 MG PO TBEC
81.0000 mg | DELAYED_RELEASE_TABLET | Freq: Every day | ORAL | Status: DC
Start: 2023-06-19 — End: 2023-06-18

## 2023-06-18 MED ORDER — SODIUM CHLORIDE 0.9 % IV SOLN: 250.0000 mL | INTRAVENOUS | Status: DC | PRN

## 2023-06-18 MED ORDER — HYDRALAZINE HCL 20 MG/ML IJ SOLN: 10.0000 mg | INTRAMUSCULAR | Status: DC | PRN

## 2023-06-18 MED ORDER — MIDAZOLAM HCL 2 MG/2ML IJ SOLN
INTRAMUSCULAR | Status: AC
  Filled 2023-06-18: qty 2

## 2023-06-18 MED ORDER — POTASSIUM CHLORIDE CRYS ER 20 MEQ PO TBCR
60.0000 meq | EXTENDED_RELEASE_TABLET | Freq: Once | ORAL | Status: AC
  Administered 2023-06-18: 60 meq via ORAL
  Filled 2023-06-18: qty 3

## 2023-06-18 MED ORDER — SODIUM CHLORIDE 0.9 % IV SOLN: INTRAVENOUS | Status: AC

## 2023-06-18 MED ORDER — NITROGLYCERIN 0.4 MG SL SUBL
0.4000 mg | SUBLINGUAL_TABLET | SUBLINGUAL | Status: DC | PRN
  Administered 2023-06-18: 0.4 mg via SUBLINGUAL
  Filled 2023-06-18: qty 1

## 2023-06-18 MED ORDER — ASPIRIN 81 MG PO CHEW
81.0000 mg | CHEWABLE_TABLET | Freq: Every day | ORAL | Status: DC
Start: 2023-06-18 — End: 2023-06-19
  Administered 2023-06-18: 81 mg via ORAL
  Filled 2023-06-18: qty 1

## 2023-06-18 MED ORDER — LIDOCAINE HCL (PF) 1 % IJ SOLN
INTRAMUSCULAR | Status: AC
  Filled 2023-06-18: qty 30

## 2023-06-18 MED ORDER — ONDANSETRON HCL 4 MG/2ML IJ SOLN: 4.0000 mg | Freq: Four times a day (QID) | INTRAMUSCULAR | Status: DC | PRN

## 2023-06-18 MED ORDER — MIDAZOLAM HCL 2 MG/2ML IJ SOLN
INTRAMUSCULAR | Status: DC | PRN
  Administered 2023-06-18: 2 mg via INTRAVENOUS

## 2023-06-18 MED ORDER — SODIUM CHLORIDE 0.9% FLUSH: 3.0000 mL | INTRAVENOUS | Status: DC | PRN

## 2023-06-18 MED ORDER — SODIUM CHLORIDE 0.9 % WEIGHT BASED INFUSION: 1.0000 mL/kg/h | INTRAVENOUS | Status: DC

## 2023-06-18 MED ORDER — SODIUM CHLORIDE 0.9 % WEIGHT BASED INFUSION
3.0000 mL/kg/h | INTRAVENOUS | Status: DC
  Administered 2023-06-18: 3 mL/kg/h via INTRAVENOUS

## 2023-06-18 MED ORDER — ACETAMINOPHEN 325 MG PO TABS: 650.0000 mg | ORAL_TABLET | ORAL | Status: DC | PRN

## 2023-06-18 MED ORDER — PERFLUTREN LIPID MICROSPHERE
1.0000 mL | INTRAVENOUS | Status: AC | PRN
  Administered 2023-06-18: 3 mL via INTRAVENOUS

## 2023-06-18 MED ORDER — ASPIRIN 81 MG PO CHEW: 81.0000 mg | CHEWABLE_TABLET | ORAL | Status: DC

## 2023-06-18 MED ORDER — FENTANYL CITRATE (PF) 100 MCG/2ML IJ SOLN
INTRAMUSCULAR | Status: AC
  Filled 2023-06-18: qty 2

## 2023-06-18 SURGICAL SUPPLY — 7 items
CATH 5FR JL3.5 JR4 ANG PIG MP (CATHETERS) IMPLANT
DEVICE RAD COMP TR BAND LRG (VASCULAR PRODUCTS) IMPLANT
GLIDESHEATH SLEND SS 6F .021 (SHEATH) IMPLANT
GUIDEWIRE INQWIRE 1.5J.035X260 (WIRE) IMPLANT
INQWIRE 1.5J .035X260CM (WIRE) ×1 IMPLANT
PACK CARDIAC CATHETERIZATION (CUSTOM PROCEDURE TRAY) ×1 IMPLANT
SET ATX-X65L (MISCELLANEOUS) IMPLANT

## 2023-06-18 NOTE — Interval H&P Note (Signed)
 History and Physical Interval Note:  06/18/2023 4:26 PM  Todd [name]  has presented today for surgery, with the diagnosis of unstable angina.  The various methods of treatment have been discussed with the patient and family. After consideration of risks, benefits and other options for treatment, the patient has consented to  Procedure(s): LEFT HEART CATH AND CORONARY ANGIOGRAPHY (N/A) as a surgical intervention.  The patient's history has been reviewed, patient examined, no change in status, stable for surgery.  I have reviewed the patient's chart and labs.  Questions were answered to the patient's satisfaction.    Cath Lab Visit (complete for each Cath Lab visit)  Clinical Evaluation Leading to the Procedure:   ACS: No.  Non-ACS:    Anginal Classification: CCS II  Anti-ischemic medical therapy: No Therapy  Non-Invasive Test Results: No non-invasive testing performed  Prior CABG: No previous CABG        Verne Carrow

## 2023-06-18 NOTE — Discharge Summary (Signed)
 Discharge Summary    Patient ID: Todd [name] MRN: [id number]; DOB: Sep 03, 1975  Admit date: 06/17/2023 Discharge date: 06/18/2023  PCP:  Vivien Presto, MD   [facility] Providers Cardiologist:  Verne Carrow, MD     Discharge Diagnoses    Principal Problem:   Chest pain Active Problems:   Hyperlipidemia with target LDL less than 70   Coronary artery disease involving native coronary artery of native heart   Diagnostic Studies/Procedures    Echo: 06/18/2023  IMPRESSIONS     1. Left ventricular ejection fraction, by estimation, is 45 to 50%. The  left ventricle has mildly decreased function. The left ventricle  demonstrates regional wall motion abnormalities (see scoring  diagram/findings for description). Left ventricular  diastolic parameters were normal.   2. Right ventricular systolic function is normal. The right ventricular  size is normal. Tricuspid regurgitation signal is inadequate for assessing  PA pressure.   3. The mitral valve is grossly normal. Trivial mitral valve  regurgitation. No evidence of mitral stenosis.   4. The aortic valve is tricuspid. There is mild calcification of the  aortic valve. Aortic valve regurgitation is not visualized. No aortic  stenosis is present.   5. The inferior vena cava is normal in size with greater than 50%  respiratory variability, suggesting right atrial pressure of 3 mmHg.   Comparison(s): No significant change from prior study. LVEF 45-50% with  lateral wall hypokinesis. More apparent on this contrast study.   FINDINGS   Left Ventricle: Left ventricular ejection fraction, by estimation, is 45  to 50%. The left ventricle has mildly decreased function. The left  ventricle demonstrates regional wall motion abnormalities. Definity  contrast agent was given IV to delineate the  left ventricular endocardial borders. The left ventricular internal cavity  size was normal in size. There is no left  ventricular hypertrophy. Left  ventricular diastolic parameters were normal.     LV Wall Scoring:  The entire lateral wall is hypokinetic.   Right Ventricle: The right ventricular size is normal. No increase in  right ventricular wall thickness. Right ventricular systolic function is  normal. Tricuspid regurgitation signal is inadequate for assessing PA  pressure.   Left Atrium: Left atrial size was normal in size.   Right Atrium: Right atrial size was normal in size.   Pericardium: There is no evidence of pericardial effusion.   Mitral Valve: The mitral valve is grossly normal. Trivial mitral valve  regurgitation. No evidence of mitral valve stenosis.   Tricuspid Valve: The tricuspid valve is grossly normal. Tricuspid valve  regurgitation is not demonstrated. No evidence of tricuspid stenosis.   Aortic Valve: The aortic valve is tricuspid. There is mild calcification  of the aortic valve. Aortic valve regurgitation is not visualized. No  aortic stenosis is present. Aortic valve mean gradient measures 6.0 mmHg.  Aortic valve peak gradient measures  11.6 mmHg. Aortic valve area, by VTI measures 2.13 cm.   Pulmonic Valve: The pulmonic valve was grossly normal. Pulmonic valve  regurgitation is not visualized. No evidence of pulmonic stenosis.   Aorta: The aortic root and ascending aorta are structurally normal, with  no evidence of dilitation.   Venous: The inferior vena cava is normal in size with greater than 50%  respiratory variability, suggesting right atrial pressure of 3 mmHg.   IAS/Shunts: The atrial septum is grossly normal.   Cath: 06/18/2023    Mid LAD lesion is 20% stenosed.   Prox RCA lesion is 20%  stenosed.   1st Diag lesion is 30% stenosed.   Previously placed Ost Cx to Mid Cx stent of unknown type is  widely patent.   The left ventricular systolic function is normal.   LV end diastolic pressure is normal.   The left ventricular ejection fraction is 55-65%  by visual estimate.   Patent Circumflex stents without restenosis Mild non-obstructive disease in the LAD, Diagonal and RCA Normal LVEDP   Recommendations: Continue medical management of CAD _____________   History of Present Illness     Todd [name] is a 47 y.o. male with CAD with posterior lateral STEMI (12/2021 with DES to Lcx) c/b VT arrest, HLD, elevated lipo A, hypothyroidism who was seen 06/18/2023 for the evaluation of chest pain. He has a history of VT arrest secondary to posterior lateral STEMI with 100% proximal left circumflex occlusion requiring DES x 2 with balloon angioplasty of the OM 2.  He had mild nonobstructive disease in the LAD and RCA at that time.  Echo demonstrated normal LV function 50 to 55%.   Had a admission a few weeks after his initial presentation for panic attacks and chest pain related symptoms for which she was observed overnight.  Since that time he has done very well, states he is not having any chest pain.  He is a part of [employer] trial and is off statin due to elevated CK.  He also stopped his metoprolol due to side effects including fatigue, erectile dysfunction.   He has been working out 3-4 times a week with very strenuous activity with power lifting as well as jujitsu.  He stated the day prior to admission that he power lifted with no issue in the morning.  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  He would have to take breaks and felt like his heart was pounding after typical exercise activity for which she would be asymptomatic prior.  Reported palpitations but no substernal chest pain at that time.  Denied nausea or emesis.  He went home and tried to recover but was still having this dull back pain for which he said is similar to his anginal symptoms from his STEMI; however, just not as severe as his prior presentation.  He took a sublingual wall but he felt like it was expired and it  did not help his symptoms.  He decided to take his blood pressure and his blood pressures were elevated 150s to 170s over 80s to 90s.  Due to the persistent chest/back pain he called EMS.  Was given 324 of aspirin.   On arrival to the ED the he was slightly hypertensive at 150/86.  EKG was nonischemic.  Theodis Aguas was 33-35.  Mild leukocytosis at 13.  Creatinine 1.4.  Given the nature of the back pain he underwent CTA that was unremarkable for aortic dissection.  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 Later that evening noted his BP was elevated in the 150-170 systolic range. Also with dull  back pain which was similar to what he experienced prior to his STEMI 2023. He took ASA, SL NTG and metoprolol without much relief and BP remained elevated  -- EKG without acute ST/T wave changes -- hsTn 33>>35 -- CT angio with no reports of PE, dissection, minimal coronary calcifications with  stent in Lcx -- initially recommendations for possible CCTa today, but given his recent dye load with CT angio but decision was mad to proceed with definitive cath -- underwent cardiac cath noted above with patent Lcx stents without ISR, non-obstructive disease in the LAD, diag, RCA. Recommendations for medical management -- echo showed LVEF of 45-50%, lateral hypokinesis, no significant valvular disease  -- continue ASA   HTN -- BPs elevated in the 150-170 systolic range at home prior to presentation -- improved in the ED -- was on metoprolol in the past, but did not tolerate   HLD -- part of [employer] study, statin was stopped in the past in setting of elevated CK   Hypothyroidism -- on synthroid  Of note, after his cardiac cath he reports he was placed on prednisone recently at [facility] for numbness in his fingers, suspect elevated BP/symptoms may have been from that. Will ask him to keep a log  of BPs and bring to follow up appt.   Patient was seen by myself and Dr. Tresa Endo and deemed stable for discharge home. Follow up arranged in the office.    Discharge Vitals Blood pressure 114/69, pulse 69, temperature 98.2 F (36.8 C), temperature source Oral, resp. rate 19, height 5\' 11"  (1.803 m), weight 119.3 kg, SpO2 97%.  Filed Weights   06/17/23 2359 06/18/23 1452  Weight: 120 kg 119.3 kg    Labs & Radiologic Studies    CBC Recent Labs    06/18/23 0032 06/18/23 0635  WBC 13.0* 9.9  NEUTROABS 8.7*  --   HGB 13.7 13.9  HCT 41.2 42.1  MCV 85.5 85.4  PLT 263 251   Basic Metabolic Panel Recent Labs    16/10/96 0032 06/18/23 0635  NA 137 137  K 3.3* 3.0*  CL 103 106  CO2 22 22  GLUCOSE 97 96  BUN 34* 26*  CREATININE 1.38* 1.27*  CALCIUM 8.6* 8.2*   Liver Function Tests No results for input(s): "AST", "ALT", "ALKPHOS", "BILITOT", "PROT", "ALBUMIN" in the last 72 hours. No results for input(s): "LIPASE", "AMYLASE" in the last 72 hours. High Sensitivity Troponin:   Recent Labs  Lab 06/18/23 0032 06/18/23 0232  TROPONINIHS 33* 35*    BNP Invalid input(s): "POCBNP" D-Dimer Recent Labs    06/18/23 0032  DDIMER 0.28   Hemoglobin A1C No results for input(s): "HGBA1C" in the last 72 hours. Fasting Lipid Panel Recent Labs    06/18/23 0635  CHOL 207*  HDL 46  LDLCALC 139*  TRIG 112  CHOLHDL 4.5   Thyroid Function Tests No results for input(s): "TSH", "T4TOTAL", "T3FREE", "THYROIDAB" in the last 72 hours.  Invalid input(s): "FREET3" _____________  CARDIAC CATHETERIZATION Result Date: 06/18/2023   Mid LAD lesion is 20% stenosed.   Prox RCA lesion is 20% stenosed.   1st Diag lesion is 30% stenosed.   Previously placed Ost Cx to Mid Cx stent of unknown type is  widely patent.   The left ventricular systolic function is normal.   LV end diastolic pressure is normal.   The left ventricular ejection fraction is 55-65% by visual estimate. Patent Circumflex  stents without restenosis Mild non-obstructive disease in the LAD, Diagonal and RCA Normal LVEDP Recommendations: Continue medical management of CAD   ECHOCARDIOGRAM COMPLETE Result Date: 06/18/2023    ECHOCARDIOGRAM REPORT   Patient Name:   Todd [name] Date of Exam: 06/18/2023 Medical Rec #:  [id number]      Height:       71.0  in Accession #:    [id number]     Weight:       264.6 lb Date of Birth:  1975/12/31       BSA:          2.375 m Patient Age:    47 years       BP:           131/88 mmHg Patient Gender: M              HR:           74 bpm. Exam Location:  Inpatient Procedure: 2D Echo, Cardiac Doppler, Color Doppler and Intracardiac            Opacification Agent (Both Spectral and Color Flow Doppler were            utilized during procedure). Indications:    Abnormal ECG  History:        Patient has prior history of Echocardiogram examinations, most                 recent 12/22/2021. CAD; Risk Factors:Dyslipidemia.  Sonographer:    Karma Ganja Referring Phys: Thana Ates IMPRESSIONS  1. Left ventricular ejection fraction, by estimation, is 45 to 50%. The left ventricle has mildly decreased function. The left ventricle demonstrates regional wall motion abnormalities (see scoring diagram/findings for description). Left ventricular diastolic parameters were normal.  2. Right ventricular systolic function is normal. The right ventricular size is normal. Tricuspid regurgitation signal is inadequate for assessing PA pressure.  3. The mitral valve is grossly normal. Trivial mitral valve regurgitation. No evidence of mitral stenosis.  4. The aortic valve is tricuspid. There is mild calcification of the aortic valve. Aortic valve regurgitation is not visualized. No aortic stenosis is present.  5. The inferior vena cava is normal in size with greater than 50% respiratory variability, suggesting right atrial pressure of 3 mmHg. Comparison(s): No significant change from prior study. LVEF 45-50% with lateral wall  hypokinesis. More apparent on this contrast study. FINDINGS  Left Ventricle: Left ventricular ejection fraction, by estimation, is 45 to 50%. The left ventricle has mildly decreased function. The left ventricle demonstrates regional wall motion abnormalities. Definity contrast agent was given IV to delineate the left ventricular endocardial borders. The left ventricular internal cavity size was normal in size. There is no left ventricular hypertrophy. Left ventricular diastolic parameters were normal.  LV Wall Scoring: The entire lateral wall is hypokinetic. Right Ventricle: The right ventricular size is normal. No increase in right ventricular wall thickness. Right ventricular systolic function is normal. Tricuspid regurgitation signal is inadequate for assessing PA pressure. Left Atrium: Left atrial size was normal in size. Right Atrium: Right atrial size was normal in size. Pericardium: There is no evidence of pericardial effusion. Mitral Valve: The mitral valve is grossly normal. Trivial mitral valve regurgitation. No evidence of mitral valve stenosis. Tricuspid Valve: The tricuspid valve is grossly normal. Tricuspid valve regurgitation is not demonstrated. No evidence of tricuspid stenosis. Aortic Valve: The aortic valve is tricuspid. There is mild calcification of the aortic valve. Aortic valve regurgitation is not visualized. No aortic stenosis is present. Aortic valve mean gradient measures 6.0 mmHg. Aortic valve peak gradient measures 11.6 mmHg. Aortic valve area, by VTI measures 2.13 cm. Pulmonic Valve: The pulmonic valve was grossly normal. Pulmonic valve regurgitation is not visualized. No evidence of pulmonic stenosis. Aorta: The aortic root and ascending aorta are structurally normal, with no evidence of dilitation. Venous: The inferior  vena cava is normal in size with greater than 50% respiratory variability, suggesting right atrial pressure of 3 mmHg. IAS/Shunts: The atrial septum is grossly normal.   LEFT VENTRICLE PLAX 2D LVIDd:         4.90 cm   Diastology LVIDs:         3.30 cm   LV e' medial:    7.29 cm/s LV PW:         1.00 cm   LV E/e' medial:  12.4 LV IVS:        0.90 cm   LV e' lateral:   11.30 cm/s LVOT diam:     2.00 cm   LV E/e' lateral: 8.0 LV SV:         71 LV SV Index:   30 LVOT Area:     3.14 cm  RIGHT VENTRICLE             IVC RV Basal diam:  4.60 cm     IVC diam: 1.50 cm RV S prime:     12.80 cm/s TAPSE (M-mode): 3.0 cm LEFT ATRIUM             Index LA diam:        3.70 cm 1.56 cm/m LA Vol (A2C):   61.0 ml 25.69 ml/m LA Vol (A4C):   37.1 ml 15.62 ml/m LA Biplane Vol: 45.4 ml 19.12 ml/m  AORTIC VALVE AV Area (Vmax):    2.01 cm AV Area (Vmean):   2.01 cm AV Area (VTI):     2.13 cm AV Vmax:           170.00 cm/s AV Vmean:          114.000 cm/s AV VTI:            0.335 m AV Peak Grad:      11.6 mmHg AV Mean Grad:      6.0 mmHg LVOT Vmax:         109.00 cm/s LVOT Vmean:        72.800 cm/s LVOT VTI:          0.227 m LVOT/AV VTI ratio: 0.68  AORTA Ao Root diam: 3.50 cm Ao Asc diam:  3.20 cm MITRAL VALVE MV Area (PHT): 3.72 cm    SHUNTS MV Decel Time: 204 msec    Systemic VTI:  0.23 m MV E velocity: 90.70 cm/s  Systemic Diam: 2.00 cm MV A velocity: 98.50 cm/s MV E/A ratio:  0.92 Lennie Odor MD Electronically signed by Lennie Odor MD Signature Date/Time: 06/18/2023/2:56:36 PM    Final    CT Angio Chest Aorta W and/or Wo Contrast Result Date: 06/18/2023 CLINICAL DATA:  Previous history of a heart attack with similar chest pain symptoms at present. EXAM: CT ANGIOGRAPHY CHEST WITH CONTRAST TECHNIQUE: Multidetector CT imaging of the chest was performed using the standard protocol during bolus administration of intravenous contrast. Multiplanar CT image reconstructions and MIPs were obtained to evaluate the vascular anatomy. RADIATION DOSE REDUCTION: This exam was performed according to the departmental dose-optimization program which includes automated exposure control, adjustment of the mA  and/or kV according to patient size and/or use of iterative reconstruction technique. CONTRAST:  75mL OMNIPAQUE IOHEXOL 350 MG/ML SOLN COMPARISON:  PA and lateral chest 01/09/2022, CTA chest, abdomen and pelvis 12/21/2021. FINDINGS: Cardiovascular: The cardiac size is normal. No pericardial effusion. There is minimal calcification in the LAD and right coronary arteries and there appears to have been interval stenting in the circumflex  artery. The aorta and great vessels are normal with the exception of a few calcifications scattered across the aortic valve leaflets. Consider echocardiographic follow-up. The pulmonary veins are nondistended. Pulmonary arteries are normal in caliber and no central embolus is seen. Mediastinum/Nodes: No enlarged mediastinal, hilar, or axillary lymph nodes. Thyroid gland, trachea, and esophagus demonstrate no significant findings. There are calcified left hilar lymph nodes. Lungs/Pleura: There is mild bronchial thickening in the upper and lower lobes. Mild chronic elevation right hemidiaphragm. There is no consolidation, effusion or pneumothorax. No pulmonary nodules are seen. There is a calcified left upper lobe perihilar granuloma. No noncalcified nodules are seen. Upper Abdomen: No acute abnormality. Musculoskeletal: No chest wall abnormality. No acute or significant osseous findings. Review of the MIP images confirms the above findings. IMPRESSION: 1. Mild bronchial thickening. 2. Aortic valve leaflet calcifications. Consider echocardiographic follow-up. The aorta and great vessels are otherwise normal. 3. Minimal coronary artery calcifications with interval stenting in the circumflex artery. 4. Old granulomatous disease. Aortic Atherosclerosis (ICD10-I70.0). Electronically Signed   By: Almira Bar M.D.   On: 06/18/2023 03:20   Disposition   Pt is being discharged home today in good condition.  Follow-up Plans & Appointments     Discharge Instructions     Call MD for:   difficulty breathing, headache or visual disturbances   Complete by: As directed    Call MD for:  redness, tenderness, or signs of infection (pain, swelling, redness, odor or green/yellow discharge around incision site)   Complete by: As directed    Diet - low sodium heart healthy   Complete by: As directed    Discharge instructions   Complete by: As directed    Radial Site Care Refer to this sheet in the next few weeks. These instructions provide you with information on caring for yourself after your procedure. Your caregiver may also give you more specific instructions. Your treatment has been planned according to current medical practices, but problems sometimes occur. Call your caregiver if you have any problems or questions after your procedure. HOME CARE INSTRUCTIONS You may shower the day after the procedure. Remove the bandage (dressing) and gently wash the site with plain soap and water. Gently pat the site dry.  Do not apply powder or lotion to the site.  Do not submerge the affected site in water for 3 to 5 days.  Inspect the site at least twice daily.  Do not flex or bend the affected arm for 24 hours.  No lifting over 5 pounds (2.3 kg) for 5 days after your procedure.  Do not drive home if you are discharged the same day of the procedure. Have someone else drive you.  You may drive 24 hours after the procedure unless otherwise instructed by your caregiver.  What to expect: Any bruising will usually fade within 1 to 2 weeks.  Blood that collects in the tissue (hematoma) may be painful to the touch. It should usually decrease in size and tenderness within 1 to 2 weeks.  SEEK IMMEDIATE MEDICAL CARE IF: You have unusual pain at the radial site.  You have redness, warmth, swelling, or pain at the radial site.  You have drainage (other than a small amount of blood on the dressing).  You have chills.  You have a fever or persistent symptoms for more than 72 hours.  You have a fever  and your symptoms suddenly get worse.  Your arm becomes pale, cool, tingly, or numb.  You have heavy bleeding  from the site. Hold pressure on the site.   Increase activity slowly   Complete by: As directed         Discharge Medications   Allergies as of 06/18/2023   No Known Allergies      Medication List     TAKE these medications    anastrozole 1 MG tablet Commonly known as: ARIMIDEX Take 0.5 mg by mouth once a week.   aspirin 81 MG chewable tablet Chew 1 tablet (81 mg total) by mouth daily.   B-COMPLEX/B-12 PO Take 1 tablet by mouth daily.   D3 5000 PO Take 1 tablet by mouth daily.   ketoconazole 2 % shampoo Commonly known as: NIZORAL Apply 1 Application topically as needed for irritation.   levothyroxine 112 MCG tablet Commonly known as: SYNTHROID Take 112 mcg by mouth daily.   MAGNESIUM PO Take 1 tablet by mouth daily.   metFORMIN 500 MG tablet Commonly known as: GLUCOPHAGE Take 500 mg by mouth daily.   multivitamin tablet Take 1 tablet by mouth daily.   NICOTINAMIDE PO Take 1 capsule by mouth daily.   nitroGLYCERIN 0.4 MG SL tablet Commonly known as: NITROSTAT Place 1 tablet (0.4 mg total) under the tongue every 5 (five) minutes as needed for chest pain.   OMEGA-3 PLUS PO Take 1 capsule by mouth daily.   testosterone cypionate 200 MG/ML injection Commonly known as: DEPOTESTOSTERONE CYPIONATE Inject 0.3 mLs into the muscle once a week.   zinc gluconate 50 MG tablet Take 50 mg by mouth daily.          Outstanding Labs/Studies   N/a  Duration of Discharge Encounter: APP Time: 15 minutes   Signed, Laverda Page, NP 06/18/2023, 5:27 PM

## 2023-06-18 NOTE — Progress Notes (Signed)
  Echocardiogram 2D Echocardiogram has been performed.  Reinaldo Raddle Alanta Scobey 06/18/2023, 1:38 PM

## 2023-06-18 NOTE — H&P (Signed)
 Cardiology Admission History and Physical   Patient ID: Todd [name] MRN: [id number]; DOB: March 15, 1976   Admission date: 06/17/2023  PCP:  Vivien Presto, MD   [facility] Providers Cardiologist:  Verne Carrow, MD        Chief Complaint:  chest pain  Patient Profile:   Todd [name] is a 48 y.o. male with CAD with posterior lateral STEMI (12/2021 with DES to Lcx) c/b VT arrest, HLD, elevated lipo A, hypothyroidism who is being seen 06/18/2023 for the evaluation of chest pain.  History of Present Illness:   Todd [name] is a 48 year old male with a history of VT arrest secondary to posterior lateral STEMI with 100% proximal left circumflex occlusion requiring DES x 2 with balloon angioplasty of the OM 2.  He had mild nonobstructive disease in the LAD and RCA at that time.  Echo demonstrated normal LV function 50 to 55%.  Had a admission a few weeks after his initial presentation for panic attacks and chest pain related symptoms for which she was observed overnight.  Since that time he has done very well, states he is not having any chest pain.  He is a part of [employer] and is off statin due to elevated CK.  He also stopped his metoprolol due to side effects including fatigue, erectile dysfunction.  He has been working out 3-4 times a week with very strenuous activity with power lifting as well as jujitsu.  He states yesterday that he power lifted with no issue in the morning.  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  He would have to take breaks and felt like his heart was pounding after typical exercise activity for which she would be asymptomatic prior.  Endorsed palpitations but no substernal chest pain at that time.  Denied nausea or emesis.  He went home and tried to recover but was still having this dull back pain for which he said is similar to his anginal symptoms from his STEMI; however,  just not as severe as his prior presentation.  He took a sublingual wall but he felt like it was expired and it did not help his symptoms.  He decided to take his blood pressure and his blood pressures were elevated 150s to 170s over 80s to 90s.  Due to the persistent chest/back pain he called EMS.  Was given 324 of aspirin.  On arrival to the ED the he was slightly hypertensive at 150/86.  EKG was nonischemic.  Theodis Aguas was 33-35.  Mild leukocytosis at 13.  Creatinine 1.4.  Given the nature of the back pain he underwent CTA that was unremarkable for aortic dissection.   Past Medical History:  Diagnosis Date   CAD (coronary artery disease)    Hyperlipidemia    NSVT (nonsustained ventricular tachycardia) (HCC) 12/23/2021   Thyroid disease     Past Surgical History:  Procedure Laterality Date   CORONARY/GRAFT ACUTE MI REVASCULARIZATION N/A 12/21/2021   Procedure: Coronary/Graft Acute MI Revascularization;  Surgeon: Kathleene Hazel, MD;  Location: [facility] INVASIVE CV LAB;  Service: Cardiovascular;  Laterality: N/A;   EYE SURGERY     LEFT HEART CATH AND CORONARY ANGIOGRAPHY N/A 12/21/2021   Procedure: LEFT HEART CATH AND CORONARY ANGIOGRAPHY;  Surgeon: Kathleene Hazel, MD;  Location: [facility] INVASIVE CV LAB;  Service: Cardiovascular;  Laterality: N/A;     Medications Prior to Admission: Prior to Admission medications   Medication  Sig Start Date End Date Taking? Authorizing Provider  anastrozole (ARIMIDEX) 1 MG tablet Take 0.5 mg by mouth once a week. 04/20/20   [provider]  aspirin 81 MG chewable tablet Chew 1 tablet (81 mg total) by mouth daily. 12/24/21   Leone Brand, NP  atorvastatin (LIPITOR) 80 MG tablet TAKE 1 TABLET BY MOUTH EVERY DAY Patient not taking: Reported on 04/22/2023 05/24/22   Kathleene Hazel, MD  B Complex Vitamins (B-COMPLEX/B-12 PO) Take 1 tablet by mouth daily.    [provider]  Cholecalciferol (D3 5000 PO) Take 1 tablet by mouth daily.     [provider]  ketoconazole (NIZORAL) 2 % shampoo Apply 1 Application topically as needed for irritation. 10/30/22   [provider]  levothyroxine (SYNTHROID) 112 MCG tablet Take 112 mcg by mouth daily. 01/30/22   [provider]  MAGNESIUM PO Take 1 tablet by mouth daily.    [provider]  metFORMIN (GLUCOPHAGE) 500 MG tablet Take 500 mg by mouth daily. 08/24/21   [provider]  Multiple Vitamin (MULTIVITAMIN) tablet Take 1 tablet by mouth daily.    [provider]  Niacinamide-Zn-Cu-Methfo-Se-Cr (NICOTINAMIDE PO) Take 1 capsule by mouth daily.    [provider]  nitroGLYCERIN (NITROSTAT) 0.4 MG SL tablet Place 1 tablet (0.4 mg total) under the tongue every 5 (five) minutes as needed for chest pain. 12/23/21   Leone Brand, NP  Omega-3 Fatty Acids (OMEGA-3 PLUS PO) Take 1 capsule by mouth daily.    [provider]  testosterone cypionate (DEPOTESTOSTERONE CYPIONATE) 200 MG/ML injection Inject 0.3 mLs into the muscle once a week. 08/27/22   [provider]  zinc gluconate 50 MG tablet Take 50 mg by mouth daily.    [provider]     Allergies:   No Known Allergies  Social History:   Social History   Socioeconomic History   Marital status: Married    Spouse name: Not on file   Number of children: Not on file   Years of education: Not on file   Highest education level: Not on file  Occupational History   Not on file  Tobacco Use   Smoking status: Never   Smokeless tobacco: Not on file  Substance and Sexual Activity   Alcohol use: No   Drug use: No   Sexual activity: Not on file  Other Topics Concern   Not on file  Social History Narrative   Not on file   Social Drivers of Health   Financial Resource Strain: Low Risk  (12/03/2022)   Received from [facility]   Overall Financial Resource Strain (CARDIA)    Difficulty of Paying Living Expenses: Not very hard  Food Insecurity: No  Food Insecurity (12/03/2022)   Received from [facility]   Hunger Vital Sign    Worried About Running Out of Food in the Last Year: Never true    Ran Out of Food in the Last Year: Never true  Transportation Needs: No Transportation Needs (12/03/2022)   Received from [facility] - Transportation    Lack of Transportation (Medical): No    Lack of Transportation (Non-Medical): No  Physical Activity: Sufficiently Active (12/03/2022)   Received from [facility]   Exercise Vital Sign    Days of Exercise per Week: 5 days    Minutes of Exercise per Session: 120 min  Stress: No Stress Concern Present (12/03/2022)   Received from [facility]  [employer] of Occupational [employer] - Occupational Stress Questionnaire    Feeling of Stress : Only a little  Social Connections: Socially Integrated (12/03/2022)   Received from [facility]   Social Network    How would you rate your social network (family, work, friends)?: Good participation with social networks  Intimate Partner Violence: Not At Risk (12/03/2022)   Received from [facility]   HITS    Over the last 12 months how often did your partner physically hurt you?: Never    Over the last 12 months how often did your partner insult you or talk down to you?: Never    Over the last 12 months how often did your partner threaten you with physical harm?: Never    Over the last 12 months how often did your partner scream or curse at you?: Never    Family History:   The patient's family history includes Heart attack (age of onset: 6) in his mother.    ROS:  Please see the history of present illness.  All other ROS reviewed and negative.     Physical Exam/Data:   Vitals:   06/18/23 0230 06/18/23 0300 06/18/23 0330 06/18/23 0400  BP: (!) 150/86 139/68 127/60 129/84  Pulse: 79 84 73 66  Resp: 11 11 16 15   Temp:      TempSrc:      SpO2: 98% 96% 94% 97%  Weight:      Height:       No intake or output data in the 24  hours ending 06/18/23 0431    06/17/2023   11:59 PM 04/22/2023    8:42 AM 02/25/2023    8:00 AM  Last 3 Weights  Weight (lbs) 264 lb 8.8 oz 265 lb 262 lb  Weight (kg) 120 kg 120.203 kg 118.842 kg     Body mass index is 36.9 kg/m.  General:  Well nourished, well developed, in no acute distress HEENT: normal Neck: no JVD Vascular: No carotid bruits; Distal pulses 2+ bilaterally   Cardiac:  normal S1, S2; RRR; no murmur  Lungs:  clear to auscultation bilaterally, no wheezing, rhonchi or rales  Abd: soft, nontender, no hepatomegaly  Ext: no edema Musculoskeletal:  No deformities, BUE and BLE strength normal and equal Skin: warm and dry  Neuro:  CNs 2-12 intact, no focal abnormalities noted Psych:  Normal affect   EKG:  The ECG that was done  was personally reviewed and demonstrates normal sinus rhythm, old posterior infarct  Relevant CV Studies: Echo 12/22/21:  1. Hypokinesis of the inferolateral wall with overall low normal LV  function.   2. Left ventricular ejection fraction, by estimation, is 50 to 55%. The  left ventricle has low normal function. The left ventricle demonstrates  regional wall motion abnormalities (see scoring diagram/findings for  description). Left ventricular diastolic   parameters are consistent with Grade I diastolic dysfunction (impaired  relaxation).   3. Right ventricular systolic function is normal. The right ventricular  size is normal.   4. The mitral valve is normal in structure. Trivial mitral valve  regurgitation. No evidence of mitral stenosis.   5. The aortic valve is tricuspid. Aortic valve regurgitation is not  visualized. Aortic valve sclerosis is present, with no evidence of aortic  valve stenosis.   6. The inferior vena cava is normal in size with greater than 50%  respiratory variability, suggesting right atrial pressure of 3 mmHg.   Laboratory Data:  High Sensitivity Troponin:  Recent Labs  Lab 06/18/23 0032 06/18/23 0232   TROPONINIHS 33* 35*      Chemistry Recent Labs  Lab 06/18/23 0032  NA 137  K 3.3*  CL 103  CO2 22  GLUCOSE 97  BUN 34*  CREATININE 1.38*  CALCIUM 8.6*  GFRNONAA >60  ANIONGAP 12    No results for input(s): "PROT", "ALBUMIN", "AST", "ALT", "ALKPHOS", "BILITOT" in the last 168 hours. Lipids No results for input(s): "CHOL", "TRIG", "HDL", "LABVLDL", "LDLCALC", "CHOLHDL" in the last 168 hours. Hematology Recent Labs  Lab 06/18/23 0032  WBC 13.0*  RBC 4.82  HGB 13.7  HCT 41.2  MCV 85.5  MCH 28.4  MCHC 33.3  RDW 13.4  PLT 263   Thyroid No results for input(s): "TSH", "FREET4" in the last 168 hours. BNPNo results for input(s): "BNP", "PROBNP" in the last 168 hours.  DDimer  Recent Labs  Lab 06/18/23 0032  DDIMER 0.28   Radiology/Studies:  CT Angio Chest Aorta W and/or Wo Contrast Result Date: 06/18/2023 CLINICAL DATA:  Previous history of a heart attack with similar chest pain symptoms at present. EXAM: CT ANGIOGRAPHY CHEST WITH CONTRAST TECHNIQUE: Multidetector CT imaging of the chest was performed using the standard protocol during bolus administration of intravenous contrast. Multiplanar CT image reconstructions and MIPs were obtained to evaluate the vascular anatomy. RADIATION DOSE REDUCTION: This exam was performed according to the departmental dose-optimization program which includes automated exposure control, adjustment of the mA and/or kV according to patient size and/or use of iterative reconstruction technique. CONTRAST:  75mL OMNIPAQUE IOHEXOL 350 MG/ML SOLN COMPARISON:  PA and lateral chest 01/09/2022, CTA chest, abdomen and pelvis 12/21/2021. FINDINGS: Cardiovascular: The cardiac size is normal. No pericardial effusion. There is minimal calcification in the LAD and right coronary arteries and there appears to have been interval stenting in the circumflex artery. The aorta and great vessels are normal with the exception of a few calcifications scattered across the  aortic valve leaflets. Consider echocardiographic follow-up. The pulmonary veins are nondistended. Pulmonary arteries are normal in caliber and no central embolus is seen. Mediastinum/Nodes: No enlarged mediastinal, hilar, or axillary lymph nodes. Thyroid gland, trachea, and esophagus demonstrate no significant findings. There are calcified left hilar lymph nodes. Lungs/Pleura: There is mild bronchial thickening in the upper and lower lobes. Mild chronic elevation right hemidiaphragm. There is no consolidation, effusion or pneumothorax. No pulmonary nodules are seen. There is a calcified left upper lobe perihilar granuloma. No noncalcified nodules are seen. Upper Abdomen: No acute abnormality. Musculoskeletal: No chest wall abnormality. No acute or significant osseous findings. Review of the MIP images confirms the above findings. IMPRESSION: 1. Mild bronchial thickening. 2. Aortic valve leaflet calcifications. Consider echocardiographic follow-up. The aorta and great vessels are otherwise normal. 3. Minimal coronary artery calcifications with interval stenting in the circumflex artery. 4. Old granulomatous disease. Aortic Atherosclerosis (ICD10-I70.0). Electronically Signed   By: Almira Bar M.D.   On: 06/18/2023 03:20   Assessment and Plan:   Chest pain/dyspnea with myocardial injury Prior posterior lateral STEMI status post PCI to Lcx Coronary artery disease Hypertension Hyperlipidemia with elevated lipo A Patient presents with relatively acute onset increased dyspnea and midsternal chest/back pain similar to prior anginal equivalents in the setting of very strenuous activity.  Noted to have elevated blood pressures.  Presentation not consistent with acute coronary syndrome but more likely related to symptomatic hypertension with myocardial injury.  His heart score is 5 and with his significant risk factors of coronary artery disease  including prior MI, elevated lipo A, ongoing testosterone use, and  significant side effects to lipids lowering therapy (is in [place] A), plan to admit for observation and TTE in the morning to assess for any WMA as well as consideration of additional BP agent. Could consider coronary CT depending on availability in scheduling as well. S/p ASA load, will hold off on heparinization at this time given BP more likely symptom cause rather than unstable angina. Significant side effects with BB, given normal EF no hard indication at this time.  - admit for observation - TTE - if scheduling allows, could consider coronary CT - continue ASA - repeat lipids - UP/CR, could consider ARB if elevated vs. Amlodipine - trend Cr - unclear protocol for [place] A but would benefit from lipid lowering agent (PSK9i) if significant statin intolerance/CK elevation for LDL < 55   Risk Assessment/Risk Scores:      Code Status: Full Code  Severity of Illness: The appropriate patient status for this patient is OBSERVATION. Observation status is judged to be reasonable and necessary in order to provide the required intensity of service to ensure the patient's safety. The patient's presenting symptoms, physical exam findings, and initial radiographic and laboratory data in the context of their medical condition is felt to place them at decreased risk for further clinical deterioration. Furthermore, it is anticipated that the patient will be medically stable for discharge from the hospital within 2 midnights of admission.    For questions or updates, please contact [facility] Please consult [email or web address] for contact info under     Signed, Thana Ates, MD  06/18/2023 4:31 AM

## 2023-06-18 NOTE — ED Provider Notes (Signed)
 [name] EMERGENCY DEPARTMENT AT [facility] Provider Note   CSN: [id number] Arrival date & time: 06/17/23  2354     History  Chief Complaint  Patient presents with   Shortness of Breath    Todd [name] is a 48 y.o. male.  This is a 48 year old male here today for short of breath, palpitations and hypertension, back pain between his shoulder blades that have been ongoing since 7 PM this evening.  Patient says it occurred while he is working out.  Patient with a history of V. tach arrest due to lateral wall MI in 2023.  Patient reports that this evening he was doing jujitsu, began to feel more winded than he usually does.  He states that he was recently taken off of his metoprolol by his cardiologist.  Patient also endorses taking prednisone for the last 3 days, being prescribed by an urgent care for was believed to be radiculopathy affecting the thumb and second finger on the left hand.  Patient has noted that when he feels more short of breath, it seems as though the numbness increases.  Patient noted that his blood sugar is elevated this evening while he was experiencing the symptoms.  He is not currently feeling short of breath, or experiencing chest pain.  His primary concern is that when he had his prior MI, he did have this pain in his back.   Shortness of Breath      Home Medications Prior to Admission medications   Medication Sig Start Date End Date Taking? Authorizing Provider  anastrozole (ARIMIDEX) 1 MG tablet Take 0.5 mg by mouth once a week. 04/20/20   [provider]  aspirin 81 MG chewable tablet Chew 1 tablet (81 mg total) by mouth daily. 12/24/21   Leone Brand, NP  atorvastatin (LIPITOR) 80 MG tablet TAKE 1 TABLET BY MOUTH EVERY DAY Patient not taking: Reported on 04/22/2023 05/24/22   Kathleene Hazel, MD  B Complex Vitamins (B-COMPLEX/B-12 PO) Take 1 tablet by mouth daily.    [provider]  Cholecalciferol (D3 5000 PO) Take  1 tablet by mouth daily.    [provider]  ketoconazole (NIZORAL) 2 % shampoo Apply 1 Application topically as needed for irritation. 10/30/22   [provider]  levothyroxine (SYNTHROID) 112 MCG tablet Take 112 mcg by mouth daily. 01/30/22   [provider]  MAGNESIUM PO Take 1 tablet by mouth daily.    [provider]  metFORMIN (GLUCOPHAGE) 500 MG tablet Take 500 mg by mouth daily. 08/24/21   [provider]  Multiple Vitamin (MULTIVITAMIN) tablet Take 1 tablet by mouth daily.    [provider]  Niacinamide-Zn-Cu-Methfo-Se-Cr (NICOTINAMIDE PO) Take 1 capsule by mouth daily.    [provider]  nitroGLYCERIN (NITROSTAT) 0.4 MG SL tablet Place 1 tablet (0.4 mg total) under the tongue every 5 (five) minutes as needed for chest pain. 12/23/21   Leone Brand, NP  Omega-3 Fatty Acids (OMEGA-3 PLUS PO) Take 1 capsule by mouth daily.    [provider]  testosterone cypionate (DEPOTESTOSTERONE CYPIONATE) 200 MG/ML injection Inject 0.3 mLs into the muscle once a week. 08/27/22   [provider]  zinc gluconate 50 MG tablet Take 50 mg by mouth daily.    [provider]      Allergies    Patient has no known allergies.    Review of Systems   Review of Systems  Respiratory:  Positive for shortness of breath.  Physical Exam Updated Vital Signs BP 129/84   Pulse 66   Temp 98.4 F (36.9 C) (Oral)   Resp 15   Ht 5\' 11"  (1.803 m)   Wt 120 kg   SpO2 97%   BMI 36.90 kg/m  Physical Exam Vitals reviewed.  Cardiovascular:     Rate and Rhythm: Normal rate. No extrasystoles are present. Pulmonary:     Effort: Pulmonary effort is normal.     Breath sounds: No decreased breath sounds, wheezing or rhonchi.  Neurological:     General: No focal deficit present.     Mental Status: He is alert.     ED Results / Procedures / Treatments   Labs (all labs ordered are listed, but only abnormal results are  displayed) Labs Reviewed  BASIC METABOLIC PANEL - Abnormal; Notable for the following components:      Result Value   Potassium 3.3 (*)    BUN 34 (*)    Creatinine, Ser 1.38 (*)    Calcium 8.6 (*)    All other components within normal limits  CBC WITH DIFFERENTIAL/PLATELET - Abnormal; Notable for the following components:   WBC 13.0 (*)    Neutro Abs 8.7 (*)    Monocytes Absolute 1.2 (*)    All other components within normal limits  TROPONIN I (HIGH SENSITIVITY) - Abnormal; Notable for the following components:   Troponin I (High Sensitivity) 33 (*)    All other components within normal limits  TROPONIN I (HIGH SENSITIVITY) - Abnormal; Notable for the following components:   Troponin I (High Sensitivity) 35 (*)    All other components within normal limits  D-DIMER, QUANTITATIVE  BASIC METABOLIC PANEL  LIPID PANEL  CBC  PROTIME-INR    EKG EKG Interpretation Date/Time:  Tuesday June 18 2023 00:35:54 EST Ventricular Rate:  75 PR Interval:  161 QRS Duration:  94 QT Interval:  358 QTC Calculation: 400 R Axis:   19  Text Interpretation: Sinus rhythm Posterior infarct, old No changes from prior 02/25/2023 Confirmed by Anders Simmonds [phone]) on 06/18/2023 12:43:24 AM  Radiology CT Angio Chest Aorta W and/or Wo Contrast Result Date: 06/18/2023 CLINICAL DATA:  Previous history of a heart attack with similar chest pain symptoms at present. EXAM: CT ANGIOGRAPHY CHEST WITH CONTRAST TECHNIQUE: Multidetector CT imaging of the chest was performed using the standard protocol during bolus administration of intravenous contrast. Multiplanar CT image reconstructions and MIPs were obtained to evaluate the vascular anatomy. RADIATION DOSE REDUCTION: This exam was performed according to the departmental dose-optimization program which includes automated exposure control, adjustment of the mA and/or kV according to patient size and/or use of iterative reconstruction technique. CONTRAST:  75mL  OMNIPAQUE IOHEXOL 350 MG/ML SOLN COMPARISON:  PA and lateral chest 01/09/2022, CTA chest, abdomen and pelvis 12/21/2021. FINDINGS: Cardiovascular: The cardiac size is normal. No pericardial effusion. There is minimal calcification in the LAD and right coronary arteries and there appears to have been interval stenting in the circumflex artery. The aorta and great vessels are normal with the exception of a few calcifications scattered across the aortic valve leaflets. Consider echocardiographic follow-up. The pulmonary veins are nondistended. Pulmonary arteries are normal in caliber and no central embolus is seen. Mediastinum/Nodes: No enlarged mediastinal, hilar, or axillary lymph nodes. Thyroid gland, trachea, and esophagus demonstrate no significant findings. There are calcified left hilar lymph nodes. Lungs/Pleura: There is mild bronchial thickening in the upper and lower lobes. Mild chronic elevation right hemidiaphragm. There is no consolidation, effusion or  pneumothorax. No pulmonary nodules are seen. There is a calcified left upper lobe perihilar granuloma. No noncalcified nodules are seen. Upper Abdomen: No acute abnormality. Musculoskeletal: No chest wall abnormality. No acute or significant osseous findings. Review of the MIP images confirms the above findings. IMPRESSION: 1. Mild bronchial thickening. 2. Aortic valve leaflet calcifications. Consider echocardiographic follow-up. The aorta and great vessels are otherwise normal. 3. Minimal coronary artery calcifications with interval stenting in the circumflex artery. 4. Old granulomatous disease. Aortic Atherosclerosis (ICD10-I70.0). Electronically Signed   By: Almira Bar M.D.   On: 06/18/2023 03:20    Procedures Procedures    Medications Ordered in ED Medications  nitroGLYCERIN (NITROSTAT) SL tablet 0.4 mg (0.4 mg Sublingual Given 06/18/23 0220)  acetaminophen (TYLENOL) tablet 650 mg (has no administration in time range)  ondansetron (ZOFRAN)  injection 4 mg (has no administration in time range)  aspirin chewable tablet 81 mg (has no administration in time range)  levothyroxine (SYNTHROID) tablet 112 mcg (has no administration in time range)  iohexol (OMNIPAQUE) 350 MG/ML injection 75 mL (75 mLs Intravenous Contrast Given 06/18/23 0148)    ED Course/ Medical Decision Making/ A&P                                 Medical Decision Making 48 year old male here today for shortness of breath, pain in the back.  Differential diagnoses include angina, ACS, consider dissection, considered PE.  Plan -patient still pending his EKG, instructed text to prioritize getting an EKG on this patient.  Story is of course concerning for an anginal equivalent.  Troponin ordered.  Chest x-ray ordered.  I have ordered a D-dimer.  Likely will obtain dissection scan on this patient.  Reassessment 4:30 AM-CTA negative.  Patient's troponins, 33, 35.  With his history, did reach out to cardiology and spoke to Dr. Hetty Ely who agreed with admitting the patient for observation.  Amount and/or Complexity of Data Reviewed Labs: ordered. Radiology: ordered.  Risk Prescription drug management. Decision regarding hospitalization.           Final Clinical Impression(s) / ED Diagnoses Final diagnoses:  Shortness of breath    Rx / DC Orders ED Discharge Orders     None         Arletha Pili, DO 06/18/23 [phone]

## 2023-06-18 NOTE — ED Notes (Addendum)
 Consent form signed and witnessed for catheterization procedure. Pt complaining of back pain but does not want medication for the pain as offered. HOB lowered and pillow repositioned. Pt denies further needs at this time.

## 2023-06-18 NOTE — Progress Notes (Addendum)
 Patient Name: Todd [name] Date of Encounter: 06/18/2023 [facility] Cardiologist: Todd Carrow, MD   Interval Summary  .    No chest pain this morning, still has back pain (reports this was similar to what he experienced prior to his MI before)  Vital Signs .    Vitals:   06/18/23 0700 06/18/23 0715 06/18/23 0723 06/18/23 0723  BP: 137/86 (!) 130/95    Pulse: 63 (!) 58    Resp: 12 15    Temp:   98.2 F (36.8 C)   TempSrc:   Oral   SpO2: 96% 96%  96%  Weight:      Height:       No intake or output data in the 24 hours ending 06/18/23 0743    06/17/2023   11:59 PM 04/22/2023    8:42 AM 02/25/2023    8:00 AM  Last 3 Weights  Weight (lbs) 264 lb 8.8 oz 265 lb 262 lb  Weight (kg) 120 kg 120.203 kg 118.842 kg      Telemetry/ECG    Sinus Rhythm - Personally Reviewed  Physical Exam .    GEN: No acute distress.   Neck: No JVD Cardiac: RRR, no murmurs, rubs, or gallops.  Respiratory: Clear to auscultation bilaterally. GI: Soft, nontender, non-distended  MS: No edema  Assessment & Plan .    48 y.o. male with CAD with posterior lateral STEMI (12/2021 with DES to Lcx) c/b VT arrest, HLD, elevated lipo A, hypothyroidism who is being seen 06/18/2023 for the evaluation of chest pain.   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 Later that evening noted his BP was elevated in the 150-170 systolic range. Also with dull  back pain which was similar to what he experienced prior to his STEMI 2023. He took ASA, SL NTG and metoprolol without much relief and BP remained elevated  -- EKG without acute ST/T wave changes -- hsTn 33>>35 -- CT angio with no reports of PE, dissection, minimal coronary calcifications with stent in Lcx -- initially recommendations for possible CCTa today, but given his recent dye load with CT angio suspect may be more beneficial to  proceed to cath. Will review with MD -- continue ASA  HTN -- BPs elevated in the 150-170 systolic range at home prior to presentation -- improved in the ED -- was on metoprolol in the past, but did not tolerate -- will add low dose amlodipine 2.5mg  daily  HLD -- part of Ocean A study, statin was stopped in the past in setting of elevated CK  Hypothyroidism -- on synthroid  For questions or updates, please contact [facility] Please consult [email or web address] for contact info under        Signed, Todd Page, NP    Patient seen and examined. Agree with assessment and plan.  Patient currently in emergency room.  He is status post prior posterolateral STEMI secondary to occlusion of the left circumflex coronary artery in September 2023.  He states his back pain that he experienced similar to his anginal symptomatology in the setting of his STEMI.  Blood pressure last evening was significantly elevated.  Troponins initially are flat.  CT angio did not reveal any PE or dissection.  I discussed therapeutic options.  Agree with recommendation for definitive cardiac catheterization today.  Patient is aware of the risk benefits of the procedure.  He  is currently enrolled in the [place] A trial regarding LP(a) with initial LP(a) 272.2.  Patient tells me he had tolerated low-dose statin therapy but when statins were maximized he developed ? liver function elevation (?CK) .  He inquired about possibly reinitiating low-dose therapy.   Todd Bihari, MD, [facility] 06/18/2023 9:11 AM

## 2023-06-18 NOTE — H&P (View-Only) (Signed)
 Patient Name: Todd [name] Date of Encounter: 06/18/2023 [facility] Cardiologist: Verne Carrow, MD   Interval Summary  .    No chest pain this morning, still has back pain (reports this was similar to what he experienced prior to his MI before)  Vital Signs .    Vitals:   06/18/23 0700 06/18/23 0715 06/18/23 0723 06/18/23 0723  BP: 137/86 (!) 130/95    Pulse: 63 (!) 58    Resp: 12 15    Temp:   98.2 F (36.8 C)   TempSrc:   Oral   SpO2: 96% 96%  96%  Weight:      Height:       No intake or output data in the 24 hours ending 06/18/23 0743    06/17/2023   11:59 PM 04/22/2023    8:42 AM 02/25/2023    8:00 AM  Last 3 Weights  Weight (lbs) 264 lb 8.8 oz 265 lb 262 lb  Weight (kg) 120 kg 120.203 kg 118.842 kg      Telemetry/ECG    Sinus Rhythm - Personally Reviewed  Physical Exam .    GEN: No acute distress.   Neck: No JVD Cardiac: RRR, no murmurs, rubs, or gallops.  Respiratory: Clear to auscultation bilaterally. GI: Soft, nontender, non-distended  MS: No edema  Assessment & Plan .    48 y.o. male with CAD with posterior lateral STEMI (12/2021 with DES to Lcx) c/b VT arrest, HLD, elevated lipo A, hypothyroidism who is being seen 06/18/2023 for the evaluation of chest pain.   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 Later that evening noted his BP was elevated in the 150-170 systolic range. Also with dull  back pain which was similar to what he experienced prior to his STEMI 2023. He took ASA, SL NTG and metoprolol without much relief and BP remained elevated  -- EKG without acute ST/T wave changes -- hsTn 33>>35 -- CT angio with no reports of PE, dissection, minimal coronary calcifications with stent in Lcx -- initially recommendations for possible CCTa today, but given his recent dye load with CT angio suspect may be more beneficial to  proceed to cath. Will review with MD -- continue ASA  HTN -- BPs elevated in the 150-170 systolic range at home prior to presentation -- improved in the ED -- was on metoprolol in the past, but did not tolerate -- will add low dose amlodipine 2.5mg  daily  HLD -- part of [place] A study, statin was stopped in the past in setting of elevated CK  Hypothyroidism -- on synthroid  For questions or updates, please contact [facility] Please consult [email or web address] for contact info under        Signed, Laverda Page, NP    Patient seen and examined. Agree with assessment and plan.  Patient currently in emergency room.  He is status post prior posterolateral STEMI secondary to occlusion of the left circumflex coronary artery in September 2023.  He states his back pain that he experienced similar to his anginal symptomatology in the setting of his STEMI.  Blood pressure last evening was significantly elevated.  Troponins initially are flat.  CT angio did not reveal any PE or dissection.  I discussed therapeutic options.  Agree with recommendation for definitive cardiac catheterization today.  Patient is aware of the risk benefits of the procedure.  He  is currently enrolled in the [place] A trial regarding LP(a) with initial LP(a) 272.2.  Patient tells me he had tolerated low-dose statin therapy but when statins were maximized he developed ? liver function elevation (?CK) .  He inquired about possibly reinitiating low-dose therapy.   Lennette Bihari, MD, [facility] 06/18/2023 9:11 AM

## 2023-06-18 NOTE — Progress Notes (Signed)
 Patient refused having right groin area shaved/prepped for cath lab

## 2023-06-19 LAB — MICROALBUMIN / CREATININE URINE RATIO
Creatinine, Urine: 151.5 mg/dL
Microalb Creat Ratio: <2 mg/g{creat} (ref 0–29)
Microalb, Ur: <3 ug/mL — ABNORMAL HIGH

## 2023-06-25 DIAGNOSIS — L738 Other specified follicular disorders: Secondary | ICD-10-CM | POA: Diagnosis not present

## 2023-06-25 DIAGNOSIS — D485 Neoplasm of uncertain behavior of skin: Secondary | ICD-10-CM | POA: Diagnosis not present

## 2023-06-25 DIAGNOSIS — L814 Other melanin hyperpigmentation: Secondary | ICD-10-CM | POA: Diagnosis not present

## 2023-07-02 ENCOUNTER — Encounter: Payer: BC Managed Care – PPO | Admitting: *Deleted

## 2023-07-02 ENCOUNTER — Ambulatory Visit: Attending: Physician Assistant | Admitting: Physician Assistant

## 2023-07-02 ENCOUNTER — Encounter: Payer: Self-pay | Admitting: Physician Assistant

## 2023-07-02 VITALS — BP 126/76 | HR 81 | Ht 71.0 in | Wt 265.6 lb

## 2023-07-02 DIAGNOSIS — I10 Essential (primary) hypertension: Secondary | ICD-10-CM | POA: Diagnosis not present

## 2023-07-02 DIAGNOSIS — Z006 Encounter for examination for normal comparison and control in clinical research program: Secondary | ICD-10-CM

## 2023-07-02 DIAGNOSIS — L738 Other specified follicular disorders: Secondary | ICD-10-CM | POA: Diagnosis not present

## 2023-07-02 DIAGNOSIS — E782 Mixed hyperlipidemia: Secondary | ICD-10-CM

## 2023-07-02 DIAGNOSIS — I2510 Atherosclerotic heart disease of native coronary artery without angina pectoris: Secondary | ICD-10-CM

## 2023-07-02 MED ORDER — NITROGLYCERIN 0.4 MG SL SUBL: 0.4000 mg | SUBLINGUAL_TABLET | SUBLINGUAL | 3 refills | Status: AC | PRN

## 2023-07-02 MED ORDER — STUDY - OCEAN(A) - OLPASIRAN (AMG 890) 142 MG/ML OR PLACEBO SQ INJECTION (PI-HILTY)
142.0000 mg | PREFILLED_SYRINGE | Freq: Once | SUBCUTANEOUS | Status: AC
  Administered 2023-07-02: 142 mg via SUBCUTANEOUS
  Filled 2023-07-02: qty 1

## 2023-07-02 MED ORDER — ATORVASTATIN CALCIUM 20 MG PO TABS: 20.0000 mg | ORAL_TABLET | Freq: Every day | ORAL | 3 refills | Status: AC

## 2023-07-02 NOTE — Patient Instructions (Signed)
 Medication Instructions:  Refilled Nitroglycerin *If you need a refill on your cardiac medications before your next appointment, please call your pharmacy*   Lab Work: NONE If you have labs (blood work) drawn today and your tests are completely normal, you will receive your results only by: MyChart Message (if you have MyChart) OR A paper copy in the mail If you have any lab test that is abnormal or we need to change your treatment, we will call you to review the results.   Testing/Procedures: NONE   Follow-Up: At [facility], you and your health needs are our priority.  As part of our continuing mission to provide you with exceptional heart care, we have created designated Provider Care Teams.  These Care Teams include your primary Cardiologist (physician) and Advanced Practice Providers (APPs -  Physician Assistants and Nurse Practitioners) who all work together to provide you with the care you need, when you need it.  We recommend signing up for the patient portal called "MyChart".  Sign up information is provided on this After Visit Summary.  MyChart is used to connect with patients for Virtual Visits (Telemedicine).  Patients are able to view lab/test results, encounter notes, upcoming appointments, etc.  Non-urgent messages can be sent to your provider as well.   To learn more about what you can do with MyChart, go to [email or web address].    Your next appointment:   6 month(s)  Provider:   Clifton James, MD  Other Instructions   1st Floor: - Lobby - Registration  - Pharmacy  - Lab - Cafe  2nd Floor: - PV Lab - Diagnostic Testing (echo, CT, nuclear med)  3rd Floor: - Vacant  4th Floor: - TCTS (cardiothoracic surgery) - AFib Clinic - Structural Heart Clinic - Vascular Surgery  - Vascular Ultrasound  5th Floor: - HeartCare Cardiology (general and EP) - Clinical Pharmacy for coumadin, hypertension, lipid, weight-loss medications, and med management  appointments    Valet parking services will be available as well.

## 2023-07-02 NOTE — Progress Notes (Signed)
 Cardiology Office Note:  .   Date:  07/02/2023  ID:  Michaelle Copas, DOB 01/26/1976, MRN [id number] PCP: Vivien Presto, MD  [facility] Providers Cardiologist:  Verne Carrow, MD {   History of Present Illness: .   Todd [name] is a 48 y.o. male with a history of CAD, hyperlipidemia, hypertension, hypothyroidism and DVT who is here for follow-up appointment.  History includes admitted to [facility] with lateral STEMI September 2023.  Presented [facility] ED with chest pain and had cardiac arrest.  Transferred to [facility] for emergent cardiac cath which showed occluded proximal circumflex.  2 drug-eluting stents were placed.  OM2 was treated with balloon angioplasty.  Mild disease in the LAD and RCA.  Echo 12/22/2021 with LVEF 55%.  No valve disease.  He is enrolled in the ocean a trial.  Off statin due to elevated CK.  Being managed by research team.  Was last seen November 2024.  Denied chest pain, dyspnea, palpitations, lower extremity edema, orthopnea, PND, dizziness, syncope/near syncope.  Today, he presents, with a history of heart disease, was recently hospitalized due to shortness of breath and high blood pressure. The patient believes these symptoms were triggered by prednisone, which was prescribed for a pinched nerve in the neck. The patient is active, regularly lifting weights and practicing jujitsu. He has made dietary changes to reduce sodium intake, which he believes has helped manage his blood pressure. The patient is also part of a heart research study and has been taken off statins due to an impact on liver function. The patient is on testosterone replacement and takes creatine as part of his workout regimen. He has noticed an increase in cholesterol levels since stopping the statin and is concerned about managing his heart health.  Reports no shortness of breath nor dyspnea on exertion. Reports no chest pain, pressure, or tightness. No edema, orthopnea, PND. Reports no  palpitations.   Discussed the use of AI scribe software for clinical note transcription with the patient, who gave verbal consent to proceed.  ROS: Pertinent ROS in HPI  Studies Reviewed: .        Echo 12/22/21:  1. Hypokinesis of the inferolateral wall with overall low normal LV  function.   2. Left ventricular ejection fraction, by estimation, is 50 to 55%. The  left ventricle has low normal function. The left ventricle demonstrates  regional wall motion abnormalities (see scoring diagram/findings for  description). Left ventricular diastolic   parameters are consistent with Grade I diastolic dysfunction (impaired  relaxation).   3. Right ventricular systolic function is normal. The right ventricular  size is normal.   4. The mitral valve is normal in structure. Trivial mitral valve  regurgitation. No evidence of mitral stenosis.   5. The aortic valve is tricuspid. Aortic valve regurgitation is not  visualized. Aortic valve sclerosis is present, with no evidence of aortic  valve stenosis.   6. The inferior vena cava is normal in size with greater than 50%  respiratory variability, suggesting right atrial pressure of 3 mmHg.        Physical Exam:   VS:  BP 126/76   Pulse 81   Ht 5\' 11"  (1.803 m)   Wt 265 lb 9.6 oz (120.5 kg)   SpO2 95%   BMI 37.04 kg/m    Wt Readings from Last 3 Encounters:  07/02/23 265 lb 9.6 oz (120.5 kg)  07/02/23 263 lb (119.3 kg)  06/18/23 263 lb 0.1 oz (119.3 kg)  GEN: Well nourished, well developed in no acute distress NECK: No JVD; No carotid bruits CARDIAC: RRR, no murmurs, rubs, gallops RESPIRATORY:  Clear to auscultation without rales, wheezing or rhonchi  ABDOMEN: Soft, non-tender, non-distended EXTREMITIES:  No edema; No deformity   ASSESSMENT AND PLAN: .   Coronary Artery Disease Coronary artery disease with previous myocardial infarction. Recent catheterization showed minor stenosis without significant disease. Echocardiogram  indicated slightly reduced ejection fraction, likely due to hypertensive and tachycardic state during hospitalization. Cardiovascular health better than peers. - Continue cardiology follow-ups every six months. - Report any symptoms of coronary artery disease immediately. - Maintain regular physical activity, including jujitsu and weightlifting.  Hypertension Hypertensive episode likely due to prednisone use, with blood pressure reaching 172 mmHg. No further symptoms post-discontinuation. Advised to avoid phenylephrine-containing medications. - Avoid prednisone and phenylephrine-containing medications. - Monitor blood pressure at home and report significant changes. - Refill nitroglycerin prescription.  Hyperlipidemia Discontinued atorvastatin due to liver concerns, resulting in LDL increase to 139 mg/dL. Discussed restarting atorvastatin at lower dose with liver monitoring. Nonstatin options considered but affect liver function. - Restart atorvastatin at 20 mg daily. - Recheck liver function tests and LDL cholesterol in two months.  Dietary Management Increased sodium intake suspected to elevate blood pressure. Reduced sodium improved blood pressure. Emphasized Mediterranean diet and exercise for blood pressure and cholesterol management. - Continue reduced sodium intake and monitor blood pressure. - Adopt a Mediterranean diet focusing on lean proteins and vegetables. - Maintain regular physical activity, including jujitsu and weightlifting.     Dispo: 6 months with Dr. Clifton James  Signed, Sharlene Dory, PA-C

## 2023-07-02 NOTE — Research (Signed)
 Todd [name]  Week 72   Vitals: [x]  Experience any AE/SAE/Hospitalizations [x]   If yes please explain: chest pain/high blood pressure - LHC - no new blockages - started prednisone   NO LABS AT NEXT 84 WEEK VISIT  Spoke with patient about lipid blinding and the importance.  Non-Fatal Potential Endpoint Assessment Yes  No   Has the subject experienced/undergone any of the following since the last visit/contact?   [x]   []    Any Coronary Artery Revascularization/Cerebrovascular Revascularization/ Peripheral Artery Revascularization/Amputation Procedure   []   [x]    Myocardial Infarction []   [x]    Stroke   []   [x]    Provide the date for the non-fatal Potential Endpoints status:   []   [x]    IP admin please see MAR (please add Box # to comment section on MAR) [x]     Current Outpatient Medications:    anastrozole (ARIMIDEX) 1 MG tablet, Take 0.5 mg by mouth once [name] week., Disp: , Rfl:    aspirin 81 MG chewable tablet, Chew 1 tablet (81 mg total) by mouth daily., Disp: , Rfl:    B Complex Vitamins (B-COMPLEX/B-12 PO), Take 1 tablet by mouth daily., Disp: , Rfl:    Cholecalciferol (D3 5000 PO), Take 1 tablet by mouth daily., Disp: , Rfl:    ketoconazole (NIZORAL) 2 % shampoo, Apply 1 Application topically as needed for irritation., Disp: , Rfl:    levothyroxine (SYNTHROID) 112 MCG tablet, Take 112 mcg by mouth daily., Disp: , Rfl:    MAGNESIUM PO, Take 1 tablet by mouth daily., Disp: , Rfl:    metFORMIN (GLUCOPHAGE) 500 MG tablet, Take 500 mg by mouth daily., Disp: , Rfl:    Multiple Vitamin (MULTIVITAMIN) tablet, Take 1 tablet by mouth daily., Disp: , Rfl:    Niacinamide-Zn-Cu-Methfo-Se-Cr (NICOTINAMIDE PO), Take 1 capsule by mouth daily., Disp: , Rfl:    nitroGLYCERIN (NITROSTAT) 0.4 MG SL tablet, Place 1 tablet (0.4 mg total) under the tongue every 5 (five) minutes as needed for chest pain., Disp: 25 tablet, Rfl: 3   Omega-3 Fatty Acids (OMEGA-3 PLUS PO), Take 1 capsule by mouth daily., Disp: ,  Rfl:    testosterone cypionate (DEPOTESTOSTERONE CYPIONATE) 200 MG/ML injection, Inject 0.3 mLs into the muscle once [name] week., Disp: , Rfl:    zinc gluconate 50 MG tablet, Take 50 mg by mouth daily., Disp: , Rfl:   Current Facility-Administered Medications:    Study - Todd([name]) - olpasiran (AMG 890) 142 mg/mL or placebo SQ injection (PI-Hilty), 142 mg, Subcutaneous, Once,

## 2023-07-04 NOTE — Research (Addendum)
 Are there any labs that are clinically significant?  Yes []  OR No[x]   Please FORWARD back to me with any changes or follow up!   Mildly elevated CK - any c/o muscle pain? Would follow with repeat labs.  Chrystie Nose, MD, [facility], FACP  [place]  [facility]  Medical Director of the Advanced Lipid Disorders &  Cardiovascular Risk Reduction Clinic Diplomate of the American Board of Clinical Lipidology Attending Cardiologist  Direct Dial: [phone]  Fax: [phone]  Website:  [email or web address]     ACCESSION NO. [id number]                                             Page 1 of 1                                                        INVESTIGATOR: ([id number])                          PROTOCOL   [id number]                     Todd [name] M.D.                             INVESTIGATOR NO.: [id number]                     c/o [name]                              SUBJECT ID: [id number]                     [facility]                 SUBJECT INITIALS NOT COLLECTED:                     [address] [place] 6A[address]                 VISIT: [place], [place] [id number]                   SPONSOR REPORT TO:                 COLLECTION TIME:08:39 DATE:02-Jul-2023                     Mertha Baars                      DATE RECEIVED IN LABORATORY: 03-Jul-2023                     c/o Sponsor(or Addtl) [facility].Study DATE REPORTED BY LABORATORY: 04-Jul-2023                     [facility]  SEX: M  BIRTHDATE:  14-Oct-1975    AGE: 14X                     [address]                  [place] NO.: [phone], [place] [address]                                                                                        Ref. Ranges               Clinical    Comments                                                                          Significance                                                                             Yes*  No                    ALT > 3 X ULN                      ALT>3XULN      Criteria not met                                        ALT > 5 X ULN                      ALT>5XULN      Criteria not met                                        ALT > 8 X ULN                      ALT>8XULN      Criteria not met                                        ALT & TBIL  ALT & TBIL     Criteria not met                                        AST > 3 X ULN                      AST>3XULN      Criteria not met                                        AST > 5 X ULN                      AST>5XULN      Criteria not met                                        AST > 8 X ULN                      AST>8XULN      Criteria not met                                        AST & TBIL                      AST & TBIL     Criteria not met                                        EGFR                      CKDEPI eGF     75           mL/min/1.3m2                                                            No Ref Rng                     CHEMISTRY PANEL                      Total Bili     0.4          0.2-1.2 mg/dL                                D-BilGen2      0.14         0.00-0.20 mg/dL                              Ind Bili       0.3  0.0-1.2 mg/dL                                Alk Phos       75           40-129 U/L                                   ALT (SGPT)     16           5-48 U/L                                    AST (SGOT)     21           8-40 U/L                                    Urea Nitr      25      H    4-24 mg/dL                                    Creatinine     1.19         0.45-1.24 mg/dL                              Calcium        9.1          8.3-10.6 mg/dL                              Total Prot     6.5          6.1-8.4 g/dL                                 Alb BCG        4.3          3.3-4.9 g/dL                                 CK              408     H    39-308 U/L                                   Sodium         139          132-147 mEq/L                                Potassium      4.3          3.5-5.2 mEq/L  Bicarb         19.9         19.3-29.3 mEq/L                              Chloride       104          94-112 mEq/L                               ADJUSTED CALCIUM                      Adj Calc       9.1          8.3-10.6 mg/dL                      HEMATOLOGY&DIFFERENTIAL PANEL                      HGB            15.1         12.7-18.1 g/dL                              HCT            46           39-54 %                                      RBC            5.4          4.5-6.4 x106/uL                              MCH            28           26-34 pg                                    MCHC           33           31-38 g/dL                                   RDW            14.0         12.0-15.0 %                                 RBC Morph      No Review Required                                       MCV            85           79-96 fL  WBC            4.77         3.80-10.70 x103/uL                           Neutrophil     2.62         1.96-7.23 x103/uL                           Lymphocyte     1.75         0.91-4.28 x103/uL                           Monocytes      0.25         0.12-0.92 x103/uL                           Eosinophil     0.11         0.00-0.57 x103/uL                           Basophils      0.04         0.00-0.20 x103/uL                           Neutrophil     54.9         40.5-75.0 %                                 Lymphocyte     36.5         15.4-48.5%                                   Monocytes      5.3          2.6-10.1 %                                   Eosinophil     2.4          0.0-6.8 %                                    Basophils      0.9          0.0-2.0 %                                    Platelets      220          140-400 x103/uL                             ANC                      ANC            2.62  1.96-7.23 x103/uL                     SM [id number] ANTIBODY COLL D/T                      CDate PreD     02-Jul-2023                                               CTime PreD     08:39                       HBA1C                      Hgb A1c        5.9          <6.5%                       ANION GAP                      Anion Gap      19      H    7-18 mEq/L                       COAGULATION GROUP                      APTT           24.4         21.9-29.4 sec                                PT             10.5         9.7-12.3 sec                                 INR            1.0          Patient not taking                                                       oral anticoagulant:                                                      0.8 - 1.2                                                                Patient taking  oral anticoagulant:                                                      2.0 - 3.0                     ALT & INR                      ALT & INR      Criteria not met                                        AST & INR                      AST & INR      Criteria not met

## 2023-07-15 NOTE — Research (Addendum)
 LMTCB regarding his elevated CK and repeat lab work   Patient called back - he will come in 07/16/2023 for repeat chem labs  No abnormal muscle aches or pains for him, he does lift weights everyday and works out.   Mercer Pod :) RN BSN  Clinical Research Nurse  Be strong and take heart, all you who hope in the [place]. ~ Psalm 31:24

## 2023-07-16 ENCOUNTER — Encounter: Admitting: *Deleted

## 2023-07-16 DIAGNOSIS — Z006 Encounter for examination for normal comparison and control in clinical research program: Secondary | ICD-10-CM

## 2023-07-16 NOTE — Research (Signed)
 Redraw chemistry only @ 0830

## 2023-07-22 NOTE — Research (Addendum)
 Are there any labs that are clinically significant?  Yes []  OR No[x]   Please FORWARD back to me with any changes or follow up!   Minimally elevated CK  Chrystie Nose, MD, [facility], FACP  [place]  [facility]  Medical Director of the Advanced Lipid Disorders &  Cardiovascular Risk Reduction Clinic Diplomate of the American Board of Clinical Lipidology Attending Cardiologist  Direct Dial: [phone]  Fax: [phone]  Website:  [email or web address]    ACCESSION NO. [id number]                                             Page 1 of 1                                                        INVESTIGATOR: ([id number])                          PROTOCOL   [id number]                     Todd [name] M.D.                             INVESTIGATOR NO.: [id number]                     c/o [name]                              SUBJECT ID: [id number]                     [facility]                 SUBJECT INITIALS NOT COLLECTED:                     [address]                 VISIT: Todd [name], [place] [id number]                   SPONSOR REPORT TO:                 COLLECTION TIME:08:30 DATE:16-Jul-2023                     Mertha Baars                      DATE RECEIVED IN LABORATORY: 17-Jul-2023                     c/o Sponsor(or Addtl) ElEC.Study DATE REPORTED BY LABORATORY: 18-Jul-2023                     [facility]  SEX: M  BIRTHDATE:  14-Oct-1975    AGE: 14X                     [address]                  Wallis Bamberg NO.: [phone], [place] [address]                                                                                        Ref. Ranges               Clinical    Comments                                                                          Significance                                                                            Yes*  No                     ALT > 3 X ULN                      ALT>3XULN      Criteria not met                                        ALT > 5 X ULN                      ALT>5XULN      Criteria not met                                        ALT > 8 X ULN                      ALT>8XULN      Criteria not met                                        ALT & TBIL  ALT & TBIL     Criteria not met                                        AST > 3 X ULN                      AST>3XULN      Criteria not met                                        AST > 5 X ULN                      AST>5XULN      Criteria not met                                        AST > 8 X ULN                      AST>8XULN      Criteria not met                                        AST & TBIL                      AST & TBIL     Criteria not met                                         EGFR                      CKDEPI eGF     75           mL/min/1.8m2                                                            No Ref Rng                      CHEMISTRY PANEL                      Total Bili     0.7          0.2-1.2 mg/dL                                D-BilGen2      0.23    H    0.00-0.20 mg/dL     60/45/4098 - 1.19                         Ind Bili       0.5  0.0-1.2 mg/dL                                Alk Phos       78           40-129 U/L                                   ALT (SGPT)     22           5-48 U/L                                    AST (SGOT)     25           8-40 U/L                                    Urea Nitr      25      H    4-24 mg/dL             95/28/4132 - 25                      Creatinine     1.19         0.45-1.24 mg/dL                              Calcium        9.4          8.3-10.6 mg/dL                              Total Prot     6.8          6.1-8.4 g/dL                                 Alb BCG        4.6          3.3-4.9 g/dL                                 CK             463     H     39-308 U/L             07/02/2023 - 408                      Sodium         140          132-147 mEq/L                                Potassium      4.3          3.5-5.2 mEq/L  Bicarb         20.0         19.3-29.3 mEq/L                              Chloride       106          94-112 mEq/L                               ADJUSTED CALCIUM                      Adj Calc       9.4          8.3-10.6 mg/dL                    ANION GAP                      Anion Gap      18           7-18 mEq/L                       IS SUBJECT FASTING?                      Fasting?       Yes

## 2023-09-17 ENCOUNTER — Encounter: Admitting: *Deleted

## 2023-09-17 DIAGNOSIS — Z006 Encounter for examination for normal comparison and control in clinical research program: Secondary | ICD-10-CM

## 2023-09-17 MED ORDER — STUDY - OCEAN(A) - OLPASIRAN (AMG 890) 142 MG/ML OR PLACEBO SQ INJECTION (PI-HILTY)
142.0000 mg | PREFILLED_SYRINGE | Freq: Once | SUBCUTANEOUS | Status: AC
  Administered 2023-09-17: 142 mg via SUBCUTANEOUS
  Filled 2023-09-17: qty 1

## 2023-09-17 NOTE — Research (Signed)
 Todd Todd [name]  Week 84  Vitals: [x]  Experience any AE/SAE/Hospitalizations []  Yes [x]  No  If yes please explain:  Labs collected:  no labs  Discussed with patient about the importance of not letting any one draw cholesterol or lipids. As to we are blinded to results. Reassured patient if we needed to be contacted the study team would reach out to our unblinded person.   ~reminder fasting labs, EKG, MD to see next visit ~  Non-Fatal Potential Endpoint Assessment Yes  No   Has the subject experienced/undergone any of the following since the last visit/contact?   []   [x]    Any Coronary Artery Revascularization/Cerebrovascular Revascularization/ Peripheral Artery Revascularization/Amputation Procedure   []   [x]    Myocardial Infarction []   [x]    Stroke   []   [x]    Provide the date for the non-fatal Potential Endpoints status:   []   [x]     IP admin please see MAR (please add Box # to comment section on MAR) [x]    Current Outpatient Medications:    anastrozole (ARIMIDEX) 1 MG tablet, Take 0.5 mg by mouth once Todd [name] week., Disp: , Rfl:    aspirin  81 MG chewable tablet, Chew 1 tablet (81 mg total) by mouth daily., Disp: , Rfl:    atorvastatin  (LIPITOR ) 20 MG tablet, Take 1 tablet (20 mg total) by mouth daily., Disp: 90 tablet, Rfl: 3   B Complex Vitamins (B-COMPLEX/B-12 PO), Take 1 tablet by mouth daily., Disp: , Rfl:    Cholecalciferol (D3 5000 PO), Take 1 tablet by mouth daily., Disp: , Rfl:    ketoconazole (NIZORAL) 2 % shampoo, Apply 1 Application topically as needed for irritation., Disp: , Rfl:    levothyroxine  (SYNTHROID ) 112 MCG tablet, Take 112 mcg by mouth daily., Disp: , Rfl:    MAGNESIUM  PO, Take 1 tablet by mouth daily., Disp: , Rfl:    metFORMIN  (GLUCOPHAGE ) 500 MG tablet, Take 500 mg by mouth daily., Disp: , Rfl:    Multiple Vitamin (MULTIVITAMIN) tablet, Take 1 tablet by mouth daily., Disp: , Rfl:    Niacinamide-Zn-Cu-Methfo-Se-Cr (NICOTINAMIDE PO), Take 1 capsule by mouth daily.,  Disp: , Rfl:    nitroGLYCERIN  (NITROSTAT ) 0.4 MG SL tablet, Place 1 tablet (0.4 mg total) under the tongue every 5 (five) minutes as needed for chest pain., Disp: 25 tablet, Rfl: 3   Omega-3 Fatty Acids (OMEGA-3 PLUS PO), Take 1 capsule by mouth daily., Disp: , Rfl:    testosterone  cypionate (DEPOTESTOSTERONE CYPIONATE) 200 MG/ML injection, Inject 0.3 mLs into the muscle once Todd [name] week., Disp: , Rfl:    zinc gluconate 50 MG tablet, Take 50 mg by mouth daily., Disp: , Rfl:   Current Facility-Administered Medications:    Study - Todd(Todd [name]) - olpasiran (AMG 890) 142 mg/mL or placebo SQ injection (PI-Hilty), 142 mg, Subcutaneous, Once,

## 2023-09-17 NOTE — Research (Signed)
 [facility] A  Informed Consent   Subject Name: Todd [name]  Subject met inclusion and exclusion criteria.  The informed consent form, study requirements and expectations were reviewed with the subject and questions and concerns were addressed prior to the signing of the consent form.  The subject verbalized understanding of the trial requirements.  The subject agreed to participate in the [facility]CE[facility] A  trial and signed the informed consent on 09/17/2023.  The informed consent was obtained prior to performance of any protocol-specific procedures for the subject.  A copy of the signed informed consent was given to the subject and a copy was placed in the subject's medical record.     Subject re-consented to  Version: 7 IRB approved:[id number]   Juniper Cobey D

## 2023-11-11 DIAGNOSIS — M79644 Pain in right finger(s): Secondary | ICD-10-CM | POA: Diagnosis not present

## 2023-11-28 DIAGNOSIS — S63630A Sprain of interphalangeal joint of right index finger, initial encounter: Secondary | ICD-10-CM | POA: Diagnosis not present

## 2023-12-06 DIAGNOSIS — E782 Mixed hyperlipidemia: Secondary | ICD-10-CM | POA: Diagnosis not present

## 2023-12-06 DIAGNOSIS — E039 Hypothyroidism, unspecified: Secondary | ICD-10-CM | POA: Diagnosis not present

## 2023-12-10 DIAGNOSIS — Z0000 Encounter for general adult medical examination without abnormal findings: Secondary | ICD-10-CM | POA: Diagnosis not present

## 2023-12-10 DIAGNOSIS — Z1330 Encounter for screening examination for mental health and behavioral disorders, unspecified: Secondary | ICD-10-CM | POA: Diagnosis not present

## 2023-12-10 DIAGNOSIS — N289 Disorder of kidney and ureter, unspecified: Secondary | ICD-10-CM | POA: Diagnosis not present

## 2023-12-10 DIAGNOSIS — Z79899 Other long term (current) drug therapy: Secondary | ICD-10-CM | POA: Diagnosis not present

## 2023-12-10 DIAGNOSIS — E291 Testicular hypofunction: Secondary | ICD-10-CM | POA: Diagnosis not present

## 2023-12-17 ENCOUNTER — Encounter: Admitting: *Deleted

## 2023-12-17 DIAGNOSIS — Z006 Encounter for examination for normal comparison and control in clinical research program: Secondary | ICD-10-CM

## 2023-12-17 MED ORDER — STUDY - OCEAN(A) - OLPASIRAN (AMG 890) 142 MG/ML OR PLACEBO SQ INJECTION (PI-HILTY)
142.0000 mg | PREFILLED_SYRINGE | Freq: Once | SUBCUTANEOUS | Status: AC
  Administered 2023-12-17: 142 mg via SUBCUTANEOUS
  Filled 2023-12-17: qty 1

## 2023-12-17 NOTE — Research (Signed)
 Todd [name]  Week 96  Vitals: [x]  Experience any AE/SAE/Hospitalizations []  Yes [x]  No  If yes please explain:  EKG: 1005 MD to see: TS Labs collected: 1000  Discussed with patient about the importance of not letting any one draw cholesterol or lipids. As to we are blinded to results. Reassured patient if we needed to be contacted the study team would reach out to our unblinded person.   No labs at next visit 108.   Non-Fatal Potential Endpoint Assessment Yes  No   Has the subject experienced/undergone any of the following since the last visit/contact?   []   [x]    Any Coronary Artery Revascularization/Cerebrovascular Revascularization/ Peripheral Artery Revascularization/Amputation Procedure   []   [x]    Myocardial Infarction []   [x]    Stroke   []   [x]    Provide the date for the non-fatal Potential Endpoints status as of today visit date    []   [x]     IP admin please see MAR (please add Box # to comment section on MAR) [x]   Current Outpatient Medications:    anastrozole (ARIMIDEX) 1 MG tablet, Take 0.5 mg by mouth once [name] week., Disp: , Rfl:    aspirin  81 MG chewable tablet, Chew 1 tablet (81 mg total) by mouth daily., Disp: , Rfl:    atorvastatin  (LIPITOR ) 20 MG tablet, Take 1 tablet (20 mg total) by mouth daily., Disp: 90 tablet, Rfl: 3   B Complex Vitamins (B-COMPLEX/B-12 PO), Take 1 tablet by mouth daily., Disp: , Rfl:    Cholecalciferol (D3 5000 PO), Take 1 tablet by mouth daily., Disp: , Rfl:    ketoconazole (NIZORAL) 2 % shampoo, Apply 1 Application topically as needed for irritation., Disp: , Rfl:    levothyroxine  (SYNTHROID ) 112 MCG tablet, Take 112 mcg by mouth daily., Disp: , Rfl:    MAGNESIUM  PO, Take 1 tablet by mouth daily., Disp: , Rfl:    metFORMIN  (GLUCOPHAGE ) 500 MG tablet, Take 500 mg by mouth daily., Disp: , Rfl:    Multiple Vitamin (MULTIVITAMIN) tablet, Take 1 tablet by mouth daily., Disp: , Rfl:    Niacinamide-Zn-Cu-Methfo-Se-Cr (NICOTINAMIDE PO), Take 1  capsule by mouth daily., Disp: , Rfl:    nitroGLYCERIN  (NITROSTAT ) 0.4 MG SL tablet, Place 1 tablet (0.4 mg total) under the tongue every 5 (five) minutes as needed for chest pain., Disp: 25 tablet, Rfl: 3   Omega-3 Fatty Acids (OMEGA-3 PLUS PO), Take 1 capsule by mouth daily., Disp: , Rfl:    tadalafil (CIALIS) 10 MG tablet, Take 10 mg by mouth., Disp: , Rfl:    testosterone  cypionate (DEPOTESTOSTERONE CYPIONATE) 200 MG/ML injection, Inject 0.3 mLs into the muscle once [name] week., Disp: , Rfl:    zinc gluconate 50 MG tablet, Take 50 mg by mouth daily., Disp: , Rfl:   Current Facility-Administered Medications:    Study - Todd([name]) - olpasiran (AMG 890) 142 mg/mL or placebo SQ injection (PI-Hilty), 142 mg, Subcutaneous, Once,

## 2023-12-19 NOTE — Progress Notes (Signed)
 Patient seen today in follow up from [facility].  He clinically is doing well, and denies any significant abnormalities.  Denies chest pain.  Had follow up cath earlier this year, and had patent IRA.  He does not have any issues with IP.   Here for follow up.    12/17/2023   9:53 AM    BP 128/87  BP Location Left Arm  Patient Position Sitting  Cuff Size Large  Pulse Rate 71  Temp 98.2 F (36.8 C)  Weight 254 lb (115.2 kg)  Resp 16  SpO2 99 %  Alert, oriented in NAD Lungs clear Cor regular Abd soft Ext no edema  ECG - NSR. WNL.  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   Debby BIRCH. Morris, MD, [facility] Medical Director, [facility]

## 2023-12-19 NOTE — Research (Addendum)
 Are there any labs that are clinically significant?  Yes []  OR No[x]   Please FORWARD back to me with any changes or follow up!  Vinie KYM Maxcy, MD, [facility], FNLA, FACP    [facility]  Medical Director of the Advanced Lipid Disorders &  Cardiovascular Risk Reduction Clinic Diplomate of the American Board of Clinical Lipidology Attending Cardiologist  Direct Dial: [phone]  Fax: [phone]  Website:  [email or web address]     ACCESSION NO. [id number]                                             Page 1 of 1                                                        INVESTIGATOR: ([id number])                          PROTOCOL   [id number]                     Vinie Maxcy M.D.                             INVESTIGATOR NO.: [id number]                     c/o Reena Lies                              SUBJECT ID: [id number]                     [facility]                 SUBJECT INITIALS NOT COLLECTED:                     [address] [place] [address]                 VISIT: RANDI Morita, [place]  [id number]                   SPONSOR REPORT TO:                 COLLECTION TIME:10:00 DATE:17-Dec-2023                     Leora Shoe                      DATE RECEIVED IN LABORATORY: 18-Dec-2023                     c/o Sponsor(or Addtl) ElEC.Study DATE REPORTED BY LABORATORY: 19-Dec-2023                     [facility]  SEX: M  BIRTHDATE:  14-Oct-1975    AGE: 47b                     [address]                  SELMA NO.: [phone]                   [place], [place]  [phone]                                                                                        Ref. Ranges               Clinical    Comments                                                                          Significance                                                                            Yes*  No                    SM [id number]  ANTIBODY COLL D/T                      CDate PreD     17-Dec-2023                                               CTime PreD     10:00                                                   SM [id number] LPA COLLECTION D/T                      Coll Date      17-Dec-2023                                               Coll Time      10:00  HBA1C                      Hgb A1c        5.8          <6.5%                      ANION GAP                      Anion Gap      18           7-18 mEq/L                    EGFR                      CKDEPI eGF     68           mL/min/1.28m2                                                            No Ref Rng                     CHEMISTRY PANEL                      Total Bili     0.5          0.2-1.2 mg/dL                                D-BilGen2      0.18         0.00-0.36 mg/dL                              Ind Bili       0.3          0.0-1.2 mg/dL                                Alk Phos       60           40-129 U/L                                   ALT (SGPT)     22           5-48 U/L                                    AST (SGOT)     19           8-40 U/L                                    Urea Nitr      25      H    4-24 mg/dL  07/02/2023 = 25                                    Creatinine     1.28         0.45-1.35 mg/dL                              Calcium         9.1          8.3-10.6 mg/dL                              Total Prot     6.7          6.1-8.4 g/dL                                 Alb BCG        4.4          3.3-4.9 g/dL                                 CK             293          39-308 U/L                                   Sodium         135          132-147 mEq/L                                Potassium      4.6          3.5-5.2 mEq/L                                Bicarb         18.7    L    19.3-29.3 mEq/L       07/02/2023 = 19.9                                  Chloride       103           94-112 mEq/L                               ADJUSTED CALCIUM                       Adj Calc       9.1          8.3-10.6 mg/dL                     HEMATOLOGY&DIFFERENTIAL PANEL                      HGB  15.2         12.7-18.1 g/dL                              HCT            47           39-54 %                                      RBC            5.5          4.5-6.4 x106/uL                              MCH            28           26-34 pg                                    MCHC           33           31-38 g/dL                                   RDW            15.9    H    12.0-15.0 %       07/02/2023 = 14.0                                    RBC Morph      No Review Required                                       MCV            84           79-96 fL                                    WBC            5.66         3.80-10.70 x103/uL                           Neutrophil     3.50         1.96-7.23 x103/uL                           Lymphocyte     1.63         0.91-4.28 x103/uL                           Monocytes      0.41         0.12-0.92 x103/uL  Eosinophil     0.08         0.00-0.57 x103/uL                           Basophils      0.03         0.00-0.20 x103/uL                           Neutrophil     61.8         40.5-75.0 %                                 Lymphocyte     28.8         15.4-48.5%                                   Monocytes      7.3          2.6-10.1 %                                   Eosinophil     1.5          0.0-6.8 %                                    Basophils      0.5          0.0-2.0 %                                    Platelets      279          140-400 x103/uL                            ANC                      ANC            3.50         1.96-7.23 x103/uL                     FASTING GLUCOSE                      Glucose        101     H    70-100 mg/dL   90/95/7975= 93                                 IS SUBJECT FASTING?                       Fasting?       Yes

## 2023-12-20 NOTE — Research (Addendum)
 Are there any labs that are clinically significant?  Yes []  OR No[x]   Please FORWARD back to me with any changes or follow up! '  Todd KYM Maxcy, MD, [facility], FNLA, FACP  [place]  [facility]  Medical Director of the Advanced Lipid Disorders &  Cardiovascular Risk Reduction Clinic Diplomate of the American Board of Clinical Lipidology Attending Cardiologist  Direct Dial: [phone]  Fax: [phone]  Website:  [email or web address]   CO[employer]                      APTT           23.6         21.9-29.4 sec                                PT             11.0         9.7-12.3 sec                                 INR            1.1          Patient not taking                                                       oral anticoagulant:                                                      0.8 - 1.2                                                                Patient taking                                                          oral anticoagulant:                                                      2.0 - 3.0                                  ALT & INR                      ALT & INR      To follow  AST & INR                      AST & INR      To follow                     DECREASE >/= EGFR 50%                      eGFR > 50%     Criteria not met                    ALT > 3 X ULN                      ALT>3XULN      Criteria not met                                        ALT > 5 X ULN                      ALT>5XULN      Criteria not met                                        ALT > 8 X ULN                      ALT>8XULN      Criteria not met                                        ALT & TBIL                      ALT & TBIL     Criteria not met                                        AST > 3 X ULN                      AST>3XULN      Criteria not met                                        AST > 5 X ULN                       AST>5XULN      Criteria not met                                        AST > 8 X ULN                      AST>8XULN      Criteria not met  AST & TBIL                      AST & TBIL     Criteria not met

## 2023-12-23 NOTE — Research (Addendum)
 Are there any labs that are clinically significant?  Yes []  OR No[x]   Please FORWARD back to me with any changes or follow up!   Vinie KYM Maxcy, MD, [facility], FNLA, FACP  [place]  [facility]  Medical Director of the Advanced Lipid Disorders &  Cardiovascular Risk Reduction Clinic Diplomate of the American Board of Clinical Lipidology Attending Cardiologist  Direct Dial: [phone]  Fax: [phone]  Website:  [email or web address]                      ALT & INR                      ALT & INR      Criteria not met                                        AST & INR                      AST & INR      Criteria not met

## 2024-03-07 ENCOUNTER — Encounter: Payer: Self-pay | Admitting: Cardiovascular Disease

## 2024-03-09 ENCOUNTER — Encounter: Admitting: *Deleted

## 2024-03-09 ENCOUNTER — Encounter: Payer: Self-pay | Admitting: *Deleted

## 2024-03-09 DIAGNOSIS — Z006 Encounter for examination for normal comparison and control in clinical research program: Secondary | ICD-10-CM

## 2024-03-09 MED ORDER — STUDY - OCEAN(A) - OLPASIRAN (AMG 890) 142 MG/ML OR PLACEBO SQ INJECTION (PI-HILTY)
142.0000 mg | PREFILLED_SYRINGE | Freq: Once | SUBCUTANEOUS | Status: AC
  Administered 2024-03-09: 142 mg via SUBCUTANEOUS
  Filled 2024-03-09: qty 1

## 2024-03-09 NOTE — Research (Signed)
 Ocean A  Week 108  Vitals: [x]  Experience any AE/SAE/Hospitalizations []  Yes [x]  No  If yes please explain:  Labs collected:  no labs  Discussed with patient about the importance of not letting any one draw cholesterol or lipids. As to we are blinded to results. Reassured patient if we needed to be contacted the study team would reach out to our unblinded person.   Subject has noticed his BP has been up and down lately. Sent cardiologist a message. Subject thinks its related to his testosterone  level. He is getting his level checked on dec 6th. He decreased his dose of testosterone  this past weekend to see if it helps with his blood pressure.   ~reminder labs next visit 120 ~  Non-Fatal Potential Endpoint Assessment Yes  No   Has the subject experienced/undergone any of the following since the last visit/contact?   []   [x]    Any Coronary Artery Revascularization/Cerebrovascular Revascularization/ Peripheral Artery Revascularization/Amputation Procedure   []   [x]    Myocardial Infarction []   [x]    Stroke   []   [x]    Provide the date for the non-fatal Potential Endpoints status as of today visit date    []   [x]     IP admin please see MAR (please add Box # to comment section on MAR) [x]    Current Outpatient Medications:    anastrozole (ARIMIDEX) 1 MG tablet, Take 0.5 mg by mouth once a week., Disp: , Rfl:    aspirin  81 MG chewable tablet, Chew 1 tablet (81 mg total) by mouth daily., Disp: , Rfl:    atorvastatin  (LIPITOR ) 20 MG tablet, Take 1 tablet (20 mg total) by mouth daily., Disp: 90 tablet, Rfl: 3   B Complex Vitamins (B-COMPLEX/B-12 PO), Take 1 tablet by mouth daily., Disp: , Rfl:    Cholecalciferol (D3 5000 PO), Take 1 tablet by mouth daily., Disp: , Rfl:    ketoconazole (NIZORAL) 2 % shampoo, Apply 1 Application topically as needed for irritation., Disp: , Rfl:    levothyroxine  (SYNTHROID ) 112 MCG tablet, Take 112 mcg by mouth daily., Disp: , Rfl:    MAGNESIUM  PO, Take 1  tablet by mouth daily., Disp: , Rfl:    metFORMIN  (GLUCOPHAGE ) 500 MG tablet, Take 500 mg by mouth daily., Disp: , Rfl:    Multiple Vitamin (MULTIVITAMIN) tablet, Take 1 tablet by mouth daily., Disp: , Rfl:    Niacinamide-Zn-Cu-Methfo-Se-Cr (NICOTINAMIDE PO), Take 1 capsule by mouth daily., Disp: , Rfl:    nitroGLYCERIN  (NITROSTAT ) 0.4 MG SL tablet, Place 1 tablet (0.4 mg total) under the tongue every 5 (five) minutes as needed for chest pain., Disp: 25 tablet, Rfl: 3   Omega-3 Fatty Acids (OMEGA-3 PLUS PO), Take 1 capsule by mouth daily., Disp: , Rfl:    tadalafil (CIALIS) 10 MG tablet, Take 10 mg by mouth., Disp: , Rfl:    testosterone  cypionate (DEPOTESTOSTERONE CYPIONATE) 200 MG/ML injection, Inject 0.3 mLs into the muscle once a week. (Patient taking differently: Inject 0.2 mLs into the muscle once a week.), Disp: , Rfl:    zinc gluconate 50 MG tablet, Take 50 mg by mouth daily., Disp: , Rfl:   Current Facility-Administered Medications:    Study - [name] - olpasiran (AMG 890) 142 mg/mL or placebo SQ injection (PI-Hilty), 142 mg, Subcutaneous, Once,

## 2024-03-16 MED ORDER — METOPROLOL SUCCINATE ER 50 MG PO TB24: 50.0000 mg | ORAL_TABLET | Freq: Every day | ORAL | Status: DC

## 2024-03-16 NOTE — Telephone Encounter (Signed)
 I spoke with patient regarding elevated BP readings.  Patient reports he was previously on Toprol  but it was stopped as his BP had improved.  He has both 50 and 25 mg tablets at home.  Last pulse was 101.  Patient reports he has been having a little shortness of breath and chest tightness off and on for the last week.  Happens at random times and occurs a couple of times per day.  Lasts about 20- 30 minutes. No chest tightness at current time.  Took one NTG last night with relief of chest pain and BP went down to 150.  Appointment made for patient to see Dr Verlin on 12/2 at 3:20.    ED precautions reviewed with patient

## 2024-03-16 NOTE — Progress Notes (Unsigned)
 No chief complaint on file.  History of Present Illness: 48 yo male with history of CAD, hyperlipidemia. HTN, hypothyoridism and VT who is here today for cardiac follow up. He was admitted to [facility] with a lateral STEMI in September 2023. He presented to [facility] ED with chest pain and had a cardiac arrest. He was transferred to [facility] for emergent cardiac cath which showed an occluded proximal Circumflex. Two drug eluting stents were placed. OM2 was treated with balloon angioplasty. Mild disease in the LAD and RCA. Echo 12/22/21 with LVEF=50-55%. No valve disease. He is in the [facility] A trial. He is now off of his statin due to elevated CK. This is being managed by the research team. He was admitted to [facility] in March 2025 with back pain and elevated blood pressure. Concern that this was his anginal equivalent. Cardiac cath March 2025 with stable CAD with patent Circumflex stent.   He is here today for follow up. The patient denies any chest pain, dyspnea, palpitations, lower extremity edema, orthopnea, PND, dizziness, near syncope or syncope.   Primary Care Physician: Bobbette Coye LABOR, MD   Past Medical History:  Diagnosis Date   CAD (coronary artery disease)    Hyperlipidemia    NSVT (nonsustained ventricular tachycardia) (HCC) 12/23/2021   Thyroid  disease     Past Surgical History:  Procedure Laterality Date   CORONARY/GRAFT ACUTE MI REVASCULARIZATION N/A 12/21/2021   Procedure: Coronary/Graft Acute MI Revascularization;  Surgeon: Verlin Lonni BIRCH, MD;  Location: [facility] INVASIVE CV LAB;  Service: Cardiovascular;  Laterality: N/A;   EYE SURGERY     LEFT HEART CATH AND CORONARY ANGIOGRAPHY N/A 12/21/2021   Procedure: LEFT HEART CATH AND CORONARY ANGIOGRAPHY;  Surgeon: Verlin Lonni BIRCH, MD;  Location: [facility] INVASIVE CV LAB;  Service: Cardiovascular;  Laterality: N/A;   LEFT HEART CATH AND CORONARY ANGIOGRAPHY N/A 06/18/2023   Procedure: LEFT HEART CATH AND CORONARY ANGIOGRAPHY;  Surgeon:  Verlin Lonni BIRCH, MD;  Location: [facility] INVASIVE CV LAB;  Service: Cardiovascular;  Laterality: N/A;    Current Outpatient Medications  Medication Sig Dispense Refill   anastrozole (ARIMIDEX) 1 MG tablet Take 0.5 mg by mouth once a week.     aspirin  81 MG chewable tablet Chew 1 tablet (81 mg total) by mouth daily.     atorvastatin  (LIPITOR ) 20 MG tablet Take 1 tablet (20 mg total) by mouth daily. 90 tablet 3   B Complex Vitamins (B-COMPLEX/B-12 PO) Take 1 tablet by mouth daily.     Cholecalciferol (D3 5000 PO) Take 1 tablet by mouth daily.     ketoconazole (NIZORAL) 2 % shampoo Apply 1 Application topically as needed for irritation.     levothyroxine  (SYNTHROID ) 112 MCG tablet Take 112 mcg by mouth daily.     MAGNESIUM  PO Take 1 tablet by mouth daily.     metFORMIN  (GLUCOPHAGE ) 500 MG tablet Take 500 mg by mouth daily.     metoprolol  succinate (TOPROL -XL) 50 MG 24 hr tablet Take 1 tablet (50 mg total) by mouth daily. Take with or immediately following a meal.     Multiple Vitamin (MULTIVITAMIN) tablet Take 1 tablet by mouth daily.     Niacinamide-Zn-Cu-Methfo-Se-Cr (NICOTINAMIDE PO) Take 1 capsule by mouth daily.     nitroGLYCERIN  (NITROSTAT ) 0.4 MG SL tablet Place 1 tablet (0.4 mg total) under the tongue every 5 (five) minutes as needed for chest pain. 25 tablet 3   Omega-3 Fatty Acids (OMEGA-3 PLUS PO) Take 1 capsule by mouth daily.  tadalafil (CIALIS) 10 MG tablet Take 10 mg by mouth.     testosterone  cypionate (DEPOTESTOSTERONE CYPIONATE) 200 MG/ML injection Inject 0.3 mLs into the muscle once a week. (Patient taking differently: Inject 0.2 mLs into the muscle once a week.)     zinc gluconate 50 MG tablet Take 50 mg by mouth daily.     No current facility-administered medications for this visit.    No Known Allergies  Social History   Socioeconomic History   Marital status: Married    Spouse name: Not on file   Number of children: Not on file   Years of education: Not on  file   Highest education level: Not on file  Occupational History   Not on file  Tobacco Use   Smoking status: Never   Smokeless tobacco: Not on file  Substance and Sexual Activity   Alcohol use: No   Drug use: No   Sexual activity: Not on file  Other Topics Concern   Not on file  Social History Narrative   Not on file   Social Drivers of Health   Financial Resource Strain: Low Risk  (12/09/2023)   Received from [facility]   Overall Financial Resource Strain (CARDIA)    How hard is it for you to pay for the very basics like food, housing, medical care, and heating?: Not very hard  Food Insecurity: No Food Insecurity (12/09/2023)   Received [facility]   Hunger Vital Sign    Within the past 12 months, you worried that your food would run out before you got the money to buy more.: Never true    Within the past 12 months, the food you bought just didn't last and you didn't have money to get more.: Never true  Transportation Needs: No Transportation Needs (12/09/2023)   Received from [facility] - Transportation    In the past 12 months, has lack of transportation kept you from medical appointments or from getting medications?: No    In the past 12 months, has lack of transportation kept you from meetings, work, or from getting things needed for daily living?: No  Physical Activity: Sufficiently Active (12/09/2023)   Received from [facility]   Exercise Vital Sign    On average, how many days per week do you engage in moderate to strenuous exercise (like a brisk walk)?: 5 days    On average, how many minutes do you engage in exercise at this level?: 120 min  Stress: No Stress Concern Present (12/09/2023)   Received from [facility] - Occupational Stress Questionnaire    Do you feel stress - tense, restless, nervous, or anxious, or unable to sleep at night because your mind is troubled all the time - these days?: Only  a little  Social Connections: Socially Integrated (12/09/2023)   Received from [facility]   Social Network    How would you rate your social network (family, work, friends)?: Good participation with social networks  Intimate Partner Violence: Not At Risk (12/09/2023)   Received from [facility]   HITS    Over the last 12 months how often did your partner physically hurt you?: Never    Over the last 12 months how often did your partner insult you or talk down to you?: Never    Over the last 12 months how often did your partner threaten you with physical harm?: Never    Over the  last 12 months how often did your partner scream or curse at you?: Sometimes    Family History  Problem Relation Age of Onset   Heart attack Mother 13    Review of Systems:  As stated in the HPI and otherwise negative.   There were no vitals taken for this visit.  Physical Examination: General: Well developed, well nourished, NAD  HEENT: OP clear, mucus membranes moist  SKIN: warm, dry. No rashes. Neuro: No focal deficits  Musculoskeletal: Muscle strength 5/5 all ext  Psychiatric: Mood and affect normal  Neck: No JVD, no carotid bruits, no thyromegaly, no lymphadenopathy.  Lungs:Clear bilaterally, no wheezes, rhonci, crackles Cardiovascular: Regular rate and rhythm. No murmurs, gallops or rubs. Abdomen:Soft. Bowel sounds present. Non-tender.  Extremities: No lower extremity edema. Pulses are 2 + in the bilateral DP/PT.  EKG:  EKG is *** ordered today. The ekg ordered today demonstrates   Recent Labs: 06/18/2023: BUN 26; Creatinine, Ser 1.27; Hemoglobin 13.9; Platelets 251; Potassium 3.0; Sodium 137   Lipid Panel    Component Value Date/Time   CHOL 207 (H) 06/18/2023 0635   CHOL 146 05/18/2022 0810   TRIG 112 06/18/2023 0635   HDL 46 06/18/2023 0635   HDL 39 (L) 05/18/2022 0810   CHOLHDL 4.5 06/18/2023 0635   VLDL 22 06/18/2023 0635   LDLCALC 139 (H) 06/18/2023 0635   LDLCALC 81 05/18/2022  0810     Wt Readings from Last 3 Encounters:  03/09/24 250 lb (113.4 kg)  12/17/23 254 lb (115.2 kg)  09/17/23 262 lb (118.8 kg)    Assessment and Plan:   1. CAD without angina: He has had *** chest pain. His blood pressure has been elevated. EKG with ***.  Continue ASA, statin and Toprol . (Toprol  resumed yesterday).   2. HTN: BP is elevated. *** Continue Toprol   3. Hyperlipidemia: Statin stopped due to elevated CK level by the research team. He is in the [facility] a study. Labs will be checked per the research team.    Labs/ tests ordered today include:   No orders of the defined types were placed in this encounter.  Disposition:   F/U with me in 12 months  Signed, Lonni Cash, MD, [facility] 03/16/2024 1:34 PM    [facility] [address] [place], [place], [place]  [address] Phone: [phone]; Fax: [phone]

## 2024-03-16 NOTE — Telephone Encounter (Signed)
 Reviewed with Dr Verlin and he would like patient to resume Toprol  50 mg daily .  Patient notified.

## 2024-03-17 ENCOUNTER — Encounter: Payer: Self-pay | Admitting: Cardiovascular Disease

## 2024-03-17 ENCOUNTER — Ambulatory Visit: Attending: Cardiovascular Disease | Admitting: Cardiovascular Disease

## 2024-03-17 VITALS — BP 144/94 | HR 88 | Ht 72.0 in | Wt 263.0 lb

## 2024-03-17 DIAGNOSIS — I10 Essential (primary) hypertension: Secondary | ICD-10-CM | POA: Diagnosis not present

## 2024-03-17 DIAGNOSIS — E782 Mixed hyperlipidemia: Secondary | ICD-10-CM | POA: Diagnosis not present

## 2024-03-17 DIAGNOSIS — I2510 Atherosclerotic heart disease of native coronary artery without angina pectoris: Secondary | ICD-10-CM | POA: Diagnosis not present

## 2024-03-17 MED ORDER — METOPROLOL SUCCINATE ER 50 MG PO TB24: 50.0000 mg | ORAL_TABLET | Freq: Every day | ORAL | 3 refills | Status: AC

## 2024-03-17 NOTE — Patient Instructions (Signed)
 Medication Instructions:  Your physician recommends that you continue on your current medications as directed. Please refer to the Current Medication list given to you today. Continue Metoprolol  Succinate 50 mg by mouth daily that was started yesterday *If you need a refill on your cardiac medications before your next appointment, please call your pharmacy*  Lab Work: none If you have labs (blood work) drawn today and your tests are completely normal, you will receive your results only by: MyChart Message (if you have MyChart) OR A paper copy in the mail If you have any lab test that is abnormal or we need to change your treatment, we will call you to review the results.  Testing/Procedures: none  Follow-Up: At [facility], you and your health needs are our priority.  As part of our continuing mission to provide you with exceptional heart care, our providers are all part of one team.  This team includes your primary Cardiologist (physician) and Advanced Practice Providers or APPs (Physician Assistants and Nurse Practitioners) who all work together to provide you with the care you need, when you need it.  Your next appointment:   12 month(s)  Provider:   Lonni Cash, MD    We recommend signing up for the patient portal called MyChart.  Sign up information is provided on this After Visit Summary.  MyChart is used to connect with patients for Virtual Visits (Telemedicine).  Patients are able to view lab/test results, encounter notes, upcoming appointments, etc.  Non-urgent messages can be sent to your provider as well.   To learn more about what you can do with MyChart, go to [email or web address].   Other Instructions

## 2024-06-01 ENCOUNTER — Encounter
